# Patient Record
Sex: Female | Born: 1965
Health system: Southern US, Community
[De-identification: ages and names within clinical notes are randomized; demographics above are authoritative.]

## PROBLEM LIST (undated history)

## (undated) DIAGNOSIS — R51 Headache: Secondary | ICD-10-CM

## (undated) DIAGNOSIS — E559 Vitamin D deficiency, unspecified: Secondary | ICD-10-CM

## (undated) DIAGNOSIS — K529 Noninfective gastroenteritis and colitis, unspecified: Secondary | ICD-10-CM

## (undated) DIAGNOSIS — H209 Unspecified iridocyclitis: Secondary | ICD-10-CM

## (undated) DIAGNOSIS — L719 Rosacea, unspecified: Secondary | ICD-10-CM

## (undated) DIAGNOSIS — R350 Frequency of micturition: Secondary | ICD-10-CM

## (undated) DIAGNOSIS — I8390 Asymptomatic varicose veins of unspecified lower extremity: Secondary | ICD-10-CM

## (undated) HISTORY — DX: Rosacea, unspecified: L71.9

## (undated) HISTORY — DX: Frequency of micturition: R35.0

## (undated) HISTORY — DX: Asymptomatic varicose veins of unspecified lower extremity: I83.90

## (undated) HISTORY — DX: Headache: R51

## (undated) HISTORY — DX: Unspecified iridocyclitis: H20.9

## (undated) HISTORY — DX: Vitamin D deficiency, unspecified: E55.9

## (undated) HISTORY — DX: Noninfective gastroenteritis and colitis, unspecified: K52.9

## (undated) HISTORY — PX: COLONOSCOPY: SHX174

---

## 2001-05-02 ENCOUNTER — Other Ambulatory Visit: Admission: RE | Admit: 2001-05-02 | Discharge: 2001-05-02 | Payer: Self-pay | Admitting: Obstetrics and Gynecology

## 2001-12-26 ENCOUNTER — Inpatient Hospital Stay (HOSPITAL_COMMUNITY): Admission: RE | Admit: 2001-12-26 | Discharge: 2001-12-29 | Payer: Self-pay | Admitting: Obstetrics and Gynecology

## 2002-04-27 ENCOUNTER — Other Ambulatory Visit: Admission: RE | Admit: 2002-04-27 | Discharge: 2002-04-27 | Payer: Self-pay | Admitting: Family Medicine

## 2005-07-20 ENCOUNTER — Ambulatory Visit (HOSPITAL_COMMUNITY): Admission: RE | Admit: 2005-07-20 | Discharge: 2005-07-20 | Payer: Self-pay | Admitting: General Surgery

## 2005-07-20 ENCOUNTER — Encounter (INDEPENDENT_AMBULATORY_CARE_PROVIDER_SITE_OTHER): Payer: Self-pay | Admitting: General Surgery

## 2006-05-31 ENCOUNTER — Encounter (INDEPENDENT_AMBULATORY_CARE_PROVIDER_SITE_OTHER): Payer: Self-pay | Admitting: Specialist

## 2006-05-31 ENCOUNTER — Ambulatory Visit (HOSPITAL_COMMUNITY): Admission: RE | Admit: 2006-05-31 | Discharge: 2006-05-31 | Payer: Self-pay | Admitting: General Surgery

## 2007-08-07 ENCOUNTER — Other Ambulatory Visit: Admission: RE | Admit: 2007-08-07 | Discharge: 2007-08-07 | Payer: Self-pay | Admitting: Obstetrics and Gynecology

## 2007-08-11 ENCOUNTER — Ambulatory Visit (HOSPITAL_COMMUNITY): Admission: RE | Admit: 2007-08-11 | Discharge: 2007-08-11 | Payer: Self-pay | Admitting: Obstetrics & Gynecology

## 2008-07-21 ENCOUNTER — Ambulatory Visit (HOSPITAL_COMMUNITY): Admission: RE | Admit: 2008-07-21 | Discharge: 2008-07-21 | Payer: Self-pay | Admitting: Obstetrics and Gynecology

## 2008-11-25 ENCOUNTER — Other Ambulatory Visit: Admission: RE | Admit: 2008-11-25 | Discharge: 2008-11-25 | Payer: Self-pay | Admitting: Obstetrics & Gynecology

## 2008-12-01 ENCOUNTER — Ambulatory Visit: Payer: Self-pay | Admitting: Internal Medicine

## 2008-12-24 ENCOUNTER — Ambulatory Visit: Payer: Self-pay | Admitting: Internal Medicine

## 2008-12-24 ENCOUNTER — Encounter: Payer: Self-pay | Admitting: Internal Medicine

## 2008-12-24 ENCOUNTER — Ambulatory Visit (HOSPITAL_COMMUNITY): Admission: RE | Admit: 2008-12-24 | Discharge: 2008-12-24 | Payer: Self-pay | Admitting: Internal Medicine

## 2008-12-31 ENCOUNTER — Encounter: Payer: Self-pay | Admitting: Internal Medicine

## 2009-02-02 DIAGNOSIS — K5289 Other specified noninfective gastroenteritis and colitis: Secondary | ICD-10-CM | POA: Insufficient documentation

## 2009-02-02 DIAGNOSIS — Z87898 Personal history of other specified conditions: Secondary | ICD-10-CM

## 2009-03-25 ENCOUNTER — Ambulatory Visit: Payer: Self-pay | Admitting: Internal Medicine

## 2009-03-25 DIAGNOSIS — K5130 Ulcerative (chronic) rectosigmoiditis without complications: Secondary | ICD-10-CM | POA: Insufficient documentation

## 2009-03-28 ENCOUNTER — Encounter: Payer: Self-pay | Admitting: Gastroenterology

## 2009-06-17 ENCOUNTER — Encounter (INDEPENDENT_AMBULATORY_CARE_PROVIDER_SITE_OTHER): Payer: Self-pay

## 2009-06-30 ENCOUNTER — Encounter: Payer: Self-pay | Admitting: Gastroenterology

## 2009-07-04 LAB — CONVERTED CEMR LAB
CO2: 23 meq/L (ref 19–32)
Calcium: 9.6 mg/dL (ref 8.4–10.5)
Chloride: 106 meq/L (ref 96–112)
Creatinine, Ser: 0.74 mg/dL (ref 0.40–1.20)
Glucose, Bld: 68 mg/dL — ABNORMAL LOW (ref 70–99)

## 2009-09-09 ENCOUNTER — Encounter (INDEPENDENT_AMBULATORY_CARE_PROVIDER_SITE_OTHER): Payer: Self-pay | Admitting: *Deleted

## 2010-01-05 ENCOUNTER — Other Ambulatory Visit: Admission: RE | Admit: 2010-01-05 | Discharge: 2010-01-05 | Payer: Self-pay | Admitting: Obstetrics and Gynecology

## 2011-02-01 LAB — BASIC METABOLIC PANEL
BUN: 6 mg/dL (ref 6–23)
CO2: 27 mEq/L (ref 19–32)
Calcium: 8.4 mg/dL (ref 8.4–10.5)
Creatinine, Ser: 0.84 mg/dL (ref 0.4–1.2)
GFR calc non Af Amer: >60 mL/min (ref >60)
Potassium: 3.7 mEq/L (ref 3.5–5.1)
Sodium: 136 mEq/L (ref 135–145)

## 2011-03-06 NOTE — Op Note (Signed)
NAME:  Rita Smith, Rita Smith               ACCOUNT NO.:  [id number]   MEDICAL RECORD NO.:  [id number]          PATIENT TYPE:  AMB   LOCATION:  DAY                           FACILITY:  [facility]   PHYSICIAN:  R. Roetta Sessions, M.D. DATE OF BIRTH:  1966-04-22   DATE OF PROCEDURE:  12/24/2008  DATE OF DISCHARGE:                               OPERATIVE REPORT   INDICATIONS FOR PROCEDURE:  A 42-year lady with a 5-year history of  intermittent bloody diarrhea and some tenesmus.  Prior colon to sigmoid  artery suggested left-sided proctocolitis.  Partial response from  topical mesalamine.  Colonoscopy is now being done to further stage  inflammatory bowel disease and size up the extent of disease.  Risks,  benefits and alternatives have been reviewed, questions answered.  Please see documentation in the medical record.   PROCEDURE NOTE:  O2 saturation, blood pressure, pulse and respirations  were monitored throughout the entire procedure.   CONSCIOUS SEDATION:  Versed 7 mg IV, Demerol 100 mg IV in divided doses.   INSTRUMENTATION:  Pentax video chip system.   FINDINGS:  Digital rectal exam revealed no abnormalities.   ENDOSCOPIC FINDINGS:  The prep was good.  Examination of rectum  demonstrated diffuse proctitis with the entire loss of normal vascular  pattern, friability and erosions.  Changes extended up to the  rectosigmoid junction 20 to 25 cm and then abruptly tapered off more  proximally.  Attempted to do retroflex but the rectal vault was small  but for the same reason I was able see the rectal mucosa very well, en  face appeared normal.  Colon:  Colonic mucosa was surveyed from the rectosigmoid junction to  the left, transverse, right colon to area of appendiceal orifice,  ileocecal valve and cecum.  These structures well seen and photographed  for the record.  Terminal ileum was intubated 10 cm.  From this level,  the scope was slowly and cautiously withdrawn.  All previously mentioned  mucosal surfaces were again seen.  The colonic mucosa as well as the  terminal ileal mucosa appeared entirely normal.  Several biopsies of the  ascending and descending segments were taken for histological  perspective.  The scope was pulled down in the rectum where biopsies of  the inflamed rectal mucosa were taken for histologic study.  The patient  tolerated the procedure well and as reacted in endoscopy.   IMPRESSION:  Inflammatory changes of the rectum up to the rectosigmoid  20 to 25 cm consistent with inflammatory bowel disease/ proctitis status  post biopsy, normal-appearing colon and terminal ileum, status post  segmental biopsy.   RECOMMENDATIONS:  Begin Lialda 4.8 grams daily.  Stop Rowasa enemas,  begin hydrocortisone enemas 100 gram enema, one per bedtime x2 weeks.  Will check a baseline BMET today.  We will plan to see this nice lady  back in the office in 6 weeks.      Jonathon Bellows, M.D.  Electronically Signed     RMR/MEDQ  D:  12/24/2008  T:  12/24/2008  Job:  [id number]   cc:   Dr. Lovell Sheehan  Dr. Phillips Odor

## 2011-03-06 NOTE — H&P (Signed)
NAME:  Rita Smith, Rita Smith               ACCOUNT NO.:  [id number]   MEDICAL RECORD NO.:  [id number]          PATIENT TYPE:  AMB   LOCATION:  DAY                           FACILITY:  [facility]   PHYSICIAN:  R. Roetta Sessions, M.D. DATE OF BIRTH:  [birth year]   DATE OF ADMISSION:  DATE OF DISCHARGE:  LH                              HISTORY & PHYSICAL   CHIEF COMPLAINT:  History of proctocolitis, bloody diarrhea.   HISTORY OF PRESENT ILLNESS:  Ms. Rita Smith is a very pleasant 45-  year-old Caucasian female, followed primarily by Dr. Colette Ribas  who, over the past 5 years or so, has had problems with tenesmus,  intermittent diarrhea and blood per rectum.  She saw Dr. Lovell Sheehan  previously who performed a colonoscopy back in 2006 and found some left-  sided proctocolitis.  She had recurrent symptoms in 2007, underwent a  sigmoidoscopy, also by Dr. Lovell Sheehan.  She was found to have again some  left-sided mild focal proctitis.  The sigmoid colon appeared  endoscopically normal according to Dr. Lovell Sheehan' notes.  Biopsies from  the descending colon in 2006 demonstrated chronic active colitis with  mild cryptitis and mild crypt abscess formation.  Ms. Rita Smith has  stuttering symptoms.  She tells me she does worsen around the Christmas  holidays.  Symptoms can be intermittent throughout the year.  She does a  little bit better during the summertime.  She may have upwards of six  bowel movements daily, not infrequently associated with blood mixed in  within the stool.  She has not lost any weight.  There is no family  history of inflammatory bowel disease or colorectal cancer.  Interestingly, she had a red eye recently and saw the ophthalmologist  and she describes being told that she had uveitis and this is associated  with inflammatory bowel disease.  She has not had any other symptoms  such as odynophagia, dysphagia, early satiety or reflux symptoms, nausea  or vomiting.  She has not had any melena.   She also occasionally takes  Advil, Aleve and has been on penicillin recently, related to some dental  problems, including a root canal.  Ms. Rita Smith has been on topical  mesalamine therapy in the way of Rowasa enemas intermittently for the  past few years with modest to moderate improvement in her symptoms but  she never feels that she gets remission.  Ms. Rita Smith does note she has  lost 20 pounds over the past one year, but she went to [employer]  and she has maintained a conscious effort to lose weight.   PAST MEDICAL HISTORY:  Significant for migraine headaches.   PAST SURGERIES:  C-section times one.  Colonoscopy and EGD as described  above.   CURRENT MEDICATIONS:  1. Rowasa enemas daily.  2. Imitrex p.r.n.  3. Allegra p.r.n.  4. Oracea  daily.   ALLERGIES:  NO KNOWN DRUG ALLERGIES   FAMILY HISTORY:  Mother age 47, in good health.  Father died, age 57,  with heart disease.  Two healthy brothers.  No history chronic GI or  liver  illness.   SOCIAL HISTORY:  Patient is married and has two children.  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  No tobacco,  rarely consumes alcohol.   REVIEW OF SYSTEMS:  Red eye, history of uveitis, as noted above.  No  skin or joint problems.  No chest pain or dyspnea on exertion.  No  fever, chills.  No change in weight.   PHYSICAL EXAMINATION:  Pleasant 45-year lady, resting comfortably.  VITAL SIGNS:  Weight 175, height 5 feet 7, temperature 98.2, BP 100/74,  pulse 72.  SKIN:  Warm and dry.  HEENT:  No scleral icterus.  Conjunctivae pink.  CHEST/LUNGS:  Clear to auscultation.  CARDIAC EXAM:  Regular rate and rhythm without murmur, gallop or rub.  ABDOMEN:  Nondistended.  Positive bowel sounds, soft, entirely nontender  without appreciable mass or organomegaly.  EXTREMITIES: No edema.  RECTAL:  Exam deferred to time of colonoscopy.   LABORATORY DATA:  From November 29, 2008, through [facility], CBC  came  back completely normal.  Chem 20 came back completely normal.  LDL  cholesterol slightly high at 107, TSH normal at 3.985.  Vitamin D 25  hydroxy level lower limit of  normal at 31.   IMPRESSION:  Ms. Rita Smith is a pleasant 45 year old lady with at  least a 5-year history of intermittent bloody diarrhea and some  tenesmus.  Prior colonoscopy and sigmoidoscopy highly suggestive of at  least left-sided proctocolitis.  She has gotten only a partial response  to topical mesalamine therapy in the way of enemas.  At this point, have  to be concerned that she may be beyond potential efficacy of enema  therapy alone.  I told Ms. Rita Smith that she ought to go ahead and have a  colonoscopy with segmental biopsies so we can restage her disease.  I  suspect she will end up on systemic mesalamine therapy.  I discussed  with her the potential for antibiotic therapy and nonsteroidal agents to  exacerbate inflammatory bowel disease.  Will plan to perform  ileocolonoscopy in the very near future with segmental biopsies as  described.  Risks, benefits, alternatives and limitations have been  reviewed.  Her questions were answered.  She is agreeable.  We will make  further recommendations in the very near future.   We will keep both Doctors Lovell Sheehan and Phillips Odor updated on her evaluation.      Jonathon Bellows, M.D.  Electronically Signed     RMR/MEDQ  D:  12/01/2008  T:  12/01/2008  Job:  [id number]   cc:   Dalia Heading, M.D.  Fax: [phone]   Corrie Mckusick, M.D.  Fax: [phone]

## 2011-03-09 NOTE — H&P (Signed)
[facility]  Patient:    Rita Smith, Rita Smith Visit Number: [id number] MRN: [id number]          Service Type: OBS Location: 4A A418 01 Attending Physician:  Tilda Burrow Dictated by:   Cathie Beams, C.N.M. Admit Date:  12/26/2001   CC:         [facility]   History and Physical  CHIEF COMPLAINT:  Regular uterine contractions since 0400 and leaking small amount of amniotic fluid.  HISTORY OF PRESENT ILLNESS:  Rita Smith is a 45 year old gravida 2, para 1, with an EDC of January 21, 2002, based on sure last menstrual period and correlating first and second trimester ultrasounds placing her at 36 weeks. She began prenatal care early in her first trimester and has had regular visits throughout. Blood pressures have been 100-120/60-80. Total weight gain 35 pounds with an elevated fundal height.  PRENATAL LABORATORY AND ACCESSORY DATA:  Blood type B positive, rubella immune, HBsAG, HIV, gonorrhea, chlamydia, GBS are all negative. One-hour GGT was 135.  Ultrasound had shown a low-lying placenta without previa and mild polyhydramnios.  PAST MEDICAL HISTORY:  History of migraines.  ALLERGIES:  No known drug allergies.  PAST SURGICAL HISTORY:  Noncontributory. No history of blood transfusions or anesthesia complications.  FAMILY HISTORY:  Positive for hypertension, lung and colon cancer.  SOCIAL HISTORY:  Married and employed.  GYNECOLOGICAL HISTORY:  1997 induction at 41 weeks. A spontaneous vaginal delivery of 7 pound 11 ounce female infant after 15 hour labor.  PHYSICAL EXAMINATION:  HEENT:  Within normal limits.  HEART:  Regular rate and rhythm.  LUNGS:  Clear to auscultation bilaterally.  ABDOMEN:  Soft and nontender with mild contractions about every 5 to 6 minutes.  CERVIX:  4 cm, 90% effaced, 0 to -1 station, leaking large amounts of clear amniotic fluid.  EXTREMITIES:  Legs are negative.  IMPRESSION: 1. Intrauterine  pregnancy at 36 weeks, beginning active labor. 2. Reassuring maternal-fetal status.  PLAN:  Expectant management at this time. Dictated by:   Cathie Beams, C.N.M. Attending Physician:  Tilda Burrow DD:  12/26/01 TD:  12/26/01 Job: [id number]

## 2011-03-09 NOTE — Discharge Summary (Signed)
[facility]  Patient:    Rita Smith, Rita Smith Visit Number: [id number] MRN: [id number]          Service Type: OBS Location: 4A A418 01 Attending Physician:  Tilda Burrow Dictated by:   Duane Lope, M.D. Admit Date:  12/26/2001 Discharge Date: 12/29/2001                             Discharge Summary  DISCHARGE DIAGNOSES: 1. Secondary arrest of dilatation, descent, the first stage of labor. 2. Status post a primary low transverse cesarean section. 3. Unremarkable postoperative course.  PROCEDURE:  Primary Cesarean section.  Please refer to the transcribed history and physical in antepartum chart for details of admission to the hospital.  HOSPITAL COURSE:  The patient presented to the hospital 36 1/[redacted] weeks gestation with ruptured membranes and in labor. Her labor was felt to be hypoactive and she was augmented with oxytocin. However, despite documented adequate labor with an IUPC, for several hours the patient did not change her cervix from 5 cm. As a result she was taken down for a C-section. There was also some late fetal tachycardia which prompted that C-section as well. The patient did well. She delivered a healthy 7 pound 3 ounce female. Surgery was without difficulty. Postoperatively the patient remained afebrile, tolerated clear liquids and a regular diet, had progression in bowel function with flatus and bowel movement. She ambulated without difficulty, tolerated oral pain medicine, and voided without problems. Her hemoglobin and hematocrit postoperatively were good. On postop day #3 it was 10.7, 30.7. As a result, she was discharged to home on the morning of postop day #3 in good stable condition to follow up in the office on Wednesday to have her staples removed. Her incision was clean, dry, and intact at the time of discharge. She is given Tylox and Motrin for pain. Dictated by:   Duane Lope, M.D. Attending Physician:  Tilda Burrow DD:   12/29/01 TD:  12/29/01 Job: [id number]

## 2011-03-09 NOTE — Op Note (Signed)
[facility]  Patient:    Rita Smith, Rita Smith Visit Number: [id number] MRN: [id number]          Service Type: OBS Location: 4A A418 01 Attending Physician:  Tilda Burrow Dictated by:   Christin Bach, M.D. Proc. Date: 12/26/01 Admit Date:  12/26/2001   CC:         Donna Bernard, M.D.   Operative Report  PREOPERATIVE DIAGNOSES: 1. Pregnancy at 36-1/[redacted] weeks gestation. 2. Spontaneous rupture of membranes with active labor. 3. Failure to progress. 4. Uncertain fetal status.  POSTOPERATIVE DIAGNOSES: 1. Pregnancy at 36-1/[redacted] weeks gestation. 2. Spontaneous rupture of membranes with active labor. 3. Failure to progress. 4. Uncertain fetal status.  OPERATION:  Primary low transverse cervical cesarean section  SURGEON:  Christin Bach, M.D.  ASSISTANT:  Cathie Beams, C.N.M.  ANESTHESIA:  Spinal.  CRNA after discontinuation of epidural catheter used during labor management.  COMPLICATIONS:  Inadvertent scratch to left index finger of surgical assistant, Cathie Beams, C.N.M.  FINDINGS:  A 7 pound 2.6 ounce female infant in the left occiput transverse position with significant molding of the vertex of a very round-headed infant not likely to deliver vaginally.  Conditions: Very high bladder flap on the anterior abdominal wall.  Extension of uterine transverse incision into uterine vessels on the right side requiring ligation of right uterine artery and vein.  DESCRIPTION OF PROCEDURE:  The patient was taken to the operating room and prepped and draped in the usual fashion for lower abdominal surgery with spinal analgesia introduced.  Pfannenstiel-type incision was performed with ease,  entering the abdomen above a very high bladder, opening the peritoneum transversely, then developing bladder flap on the well-developed lower uterine segment.  The uterine incision was made carefully and transversely, easily identifying the amniotic  fluid sac with transverse extension of the incision using index finger traction.  The fetal vertex was dislodged from the pelvic inlet, lifted up, and rotated into the incision whereupon fundal pressure was used to deliver the infant.  There was significant technical difficulty getting the baby out as the shoulders were caught at the uterine incision due to the relatively high bladder position and the tortuous angles required to deliver the infant.  We used fundal pressures as primary propulsive force on the infant when combined with index finger under each axilla to deliver his shoulders.  The infant was delivered in good condition, passed to the awaiting Pediatrician, Dr. Simone Curia in good condition.  (See his note for further details.)  The uterus was immediately filled with generous bleeding and found to have brisk bleeding from the uterine vessels on the right side which were crossclamped with Pennington clamps.  Two encircling sutures of 0 chromic were placed above and below the laceration to reduce flow through the uterine vessels on the left side.  We then irrigated the uterine cavity and closed the uterus in two-layer closure with the first layer being running locking 0 chromic and the second layer being continuous running 0 chromic.  The right angle once again required two figure-of-eight sutures to complete hemostasis after it began to ooze during assessment of the bladder flap.  The bladder flap was then reapproximated using continuous running 2-0 Prolene.  The uterus had been irrigated when opened.  The abdomen was now irrigated and generous blood and clots removed from the abdomen.  The fluid returned clear, and then we closed the anterior peritoneum with 2-0 chromic.  One inadvertent complication occurred during closure of the  fascia which was that, during the continuous running 0 Vicryl closure of the fascia, we scraped the left index finger of the surgical assistant,  enough to break the skin but not cause bleeding.  It was a linear surface scrape, approximately 5 to 7 mm in length.   Ms. Marzella Schlein promptly broke out of the case and took care of the cleansing and scrubbing of the incision as per precaution.  Fascial closure was completed, 2-0 chromic used for the subcutaneous tissue and staple closure completed the procedure.  The patient tolerated the procedure well and went to recovery in good condition. Dictated by:   Christin Bach, M.D. Attending Physician:  Tilda Burrow DD:  12/26/01 TD:  12/28/01 Job: [id number]

## 2011-03-09 NOTE — Op Note (Signed)
[facility]  Patient:    KAYTLAN, BEHRMAN Visit Number: [id number] MRN: [id number]          Service Type: OBS Location: 4A A418 01 Attending Physician:  Tilda Burrow Dictated by:   Christin Bach, M.D. Proc. Date: 12/26/01 Admit Date:  12/26/2001                             Operative Report  TIME:  10 a.m., December 26, 2001.  PROCEDURE:  Lumbar epidural catheter placement for labor analgesia.  SURGEON:  Christin Bach, M.D.  DETAILS OF PROCEDURE:  The patient was placed in sitting position after fluid bolus and IV Pepcid.  The forward flexion position was used, and back prepped and draped.  Loss of resistance technique was used at L3-4 interspace to find the epidural space where 5 cc of 1.5%  Xylocaine with 1:100,000 epinephrine dilution was injection.  Then the epidural catheter was inserted 3 cm into the epidural space.  The patient had excellent effect for the epidural which was taped to the back in standard fashion and attached to a pump where a 5 cc bolus of 0.125% Marcaine was administered with continuous infusion at 12 cc per hour initiated.  The patient tolerated the procedure well and with T10 epidural level achieved. Dictated by:   Christin Bach, M.D. Attending Physician:  Tilda Burrow DD:  12/26/01 TD:  12/27/01 Job: [id number]

## 2011-03-09 NOTE — H&P (Signed)
Rita Smith, Rita Smith               ACCOUNT NO.:  [id number]   MEDICAL RECORD NO.:  [id number]          PATIENT TYPE:  AMB   LOCATION:                                FACILITY:  [facility]   PHYSICIAN:  Dalia Heading, M.D.  DATE OF BIRTH:  Feb 21, 1966   DATE OF ADMISSION:  05/31/2006  DATE OF DISCHARGE:  LH                                HISTORY & PHYSICAL   CHIEF COMPLAINT:  Hematochezia, history of colitis.   HISTORY OF PRESENT ILLNESS:  The patient is a 45 year old white female who  is referred for endoscopic evaluation.  She needs a flexible sigmoidoscopy  for followup of colitis.  Her hematochezia returned recently after having  resolved after Rowasa enemas last year.  She had a colonoscopy in September  2006 which revealed descending colitis which was nonspecific in nature.  There is no family history of colon carcinoma.   PAST MEDICAL HISTORY:  Includes sinusitis, migraine headaches.   PAST SURGICAL HISTORY:  As noted above, C-sections.   CURRENT MEDICATIONS:  Allegra, Imitrex, Advil.   ALLERGIES:  No known drug allergies.   REVIEW OF SYSTEMS:  Noncontributory.   PHYSICAL EXAMINATION:  GENERAL:  The patient a well-developed, well-  nourished white female in no acute distress.  LUNGS:  Clear to auscultation with equal breath sounds bilaterally.  HEART: Examination reveals regular rate and rhythm without history, S4,  murmurs.  ABDOMEN: Soft, nontender, nondistended.  No hepatosplenomegaly or masses  noted.  RECTAL:  Examination was deferred to the procedure.   IMPRESSION:  1.  Hematochezia.  2.  History of colitis.   PLAN:  The patient is scheduled for flexible sigmoidoscopy on May 31, 2006.  The risks and benefits of the procedure including bleeding and  perforation were fully explained to the patient, gave informed consent.      Dalia Heading, M.D.  Electronically Signed     MAJ/MEDQ  D:  05/28/2006  T:  05/28/2006  Job:  [id number]   cc:   [facility]  Fax: [phone]   Corrie Mckusick, M.D.  Fax: [phone]

## 2011-03-09 NOTE — H&P (Signed)
NAME:  Rita Smith, Rita Smith NO.:  [id number]   MEDICAL RECORD NO.:  [id number]          PATIENT TYPE:  AMB   LOCATION:                                FACILITY:  [facility]   PHYSICIAN:  Dalia Heading, M.D.  DATE OF BIRTH:  January 05, 1966   DATE OF ADMISSION:  DATE OF DISCHARGE:  LH                                HISTORY & PHYSICAL   CHIEF COMPLAINT:  Hematochezia and diarrhea.   HISTORY OF PRESENT ILLNESS:  The patient is a 45 year old white female who  is referred for endoscopic evaluation. She needs a colonoscopy for  hematochezia and diarrhea. Both have been present for over one month. No  abdominal pain, weight loss, nausea, vomiting, constipation, or melena have  been noted. She has never had a colonoscopy. There is no family history of  colon carcinoma. She does have a history of hemorrhoidal disease.   PAST MEDICAL HISTORY:  Sinusitis and migraines headaches.   PAST SURGICAL HISTORY:  Cesarean sections.   CURRENT MEDICATIONS:  Allegra, Imitrex, Advil.   ALLERGIES:  No known drug allergies.   REVIEW OF SYSTEMS:  Noncontributory.   PHYSICAL EXAMINATION:  GENERAL:  The patient is a well-developed, well-  nourished, white female in no acute distress. She is afebrile and vital  signs are stable.  LUNGS:  Clear to auscultation with equal breath sounds bilaterally.  HEART:  Reveals regular rate and rhythm without S3, S4, or murmurs.  ABDOMEN:  Soft, nontender, nondistended. No hepatosplenomegaly or masses are  noted.  RECTAL:  Deferred to the procedure.   IMPRESSION:  Hematochezia, diarrhea.   PLAN:  The patient is scheduled for colonoscopy on July 20, 2005. The  risks and benefits of the procedure including bleeding and perforation were  fully explained to the patient who gave informed consent.      Dalia Heading, M.D.  Electronically Signed     MAJ/MEDQ  D:  07/10/2005  T:  07/10/2005  Job:  [id number]   cc:   [facility]  Fax:  [phone]   Corrie Mckusick, M.D.  Fax: [phone]

## 2011-11-22 ENCOUNTER — Other Ambulatory Visit (HOSPITAL_COMMUNITY)
Admission: RE | Admit: 2011-11-22 | Discharge: 2011-11-22 | Disposition: A | Discharge status: Home / Self Care | Payer: BC Managed Care – PPO | Source: Ambulatory Visit | Attending: Obstetrics and Gynecology | Admitting: Obstetrics and Gynecology

## 2011-11-22 ENCOUNTER — Other Ambulatory Visit: Payer: Self-pay | Admitting: Adult Health

## 2011-11-22 DIAGNOSIS — Z01419 Encounter for gynecological examination (general) (routine) without abnormal findings: Secondary | ICD-10-CM | POA: Insufficient documentation

## 2012-08-01 ENCOUNTER — Other Ambulatory Visit: Payer: Self-pay | Admitting: Obstetrics and Gynecology

## 2012-08-01 DIAGNOSIS — Z139 Encounter for screening, unspecified: Secondary | ICD-10-CM

## 2012-08-07 ENCOUNTER — Ambulatory Visit (HOSPITAL_COMMUNITY)
Admission: RE | Admit: 2012-08-07 | Discharge: 2012-08-07 | Disposition: A | Discharge status: Home / Self Care | Payer: BC Managed Care – PPO | Source: Ambulatory Visit | Attending: Obstetrics and Gynecology | Admitting: Obstetrics and Gynecology

## 2012-08-07 DIAGNOSIS — Z1231 Encounter for screening mammogram for malignant neoplasm of breast: Secondary | ICD-10-CM | POA: Insufficient documentation

## 2012-08-07 DIAGNOSIS — Z139 Encounter for screening, unspecified: Secondary | ICD-10-CM

## 2013-04-03 ENCOUNTER — Encounter: Payer: Self-pay | Admitting: *Deleted

## 2013-04-06 ENCOUNTER — Ambulatory Visit: Payer: Self-pay | Admitting: Women's Health

## 2013-04-14 ENCOUNTER — Ambulatory Visit (INDEPENDENT_AMBULATORY_CARE_PROVIDER_SITE_OTHER): Payer: BC Managed Care – PPO | Admitting: Women's Health

## 2013-04-14 ENCOUNTER — Encounter: Payer: Self-pay | Admitting: Women's Health

## 2013-04-14 VITALS — BP 130/82 | Ht 67.0 in | Wt 186.4 lb

## 2013-04-14 DIAGNOSIS — R1900 Intra-abdominal and pelvic swelling, mass and lump, unspecified site: Secondary | ICD-10-CM

## 2013-04-14 NOTE — Patient Instructions (Signed)
Perimenopause Perimenopause is the time when your body begins to move into the menopause (no menstrual period for 12 straight months). It is a natural process. Perimenopause can begin 2 to 8 years before the menopause and usually lasts for one year after the menopause. During this time, your ovaries may or may not produce an egg. The ovaries vary in their production of estrogen and progesterone hormones each month. This can cause irregular menstrual periods, difficulty in getting pregnant, vaginal bleeding between periods and uncomfortable symptoms. CAUSES  Irregular production of the ovarian hormones, estrogen and progesterone, and not ovulating every month.  Other causes include:  Tumor of the pituitary gland in the brain.  Medical disease that affects the ovaries.  Radiation treatment.  Chemotherapy.  Unknown causes.  Heavy smoking and excessive alcohol intake can bring on perimenopause sooner. SYMPTOMS   Hot flashes.  Night sweats.  Irregular menstrual periods.  Decrease sex drive.  Vaginal dryness.  Headaches.  Mood swings.  Depression.  Memory problems.  Irritability.  Tiredness.  Weight gain.  Trouble getting pregnant.  The beginning of losing bone cells (osteoporosis).  The beginning of hardening of the arteries (atherosclerosis). DIAGNOSIS  Your caregiver will make a diagnosis by analyzing your age, menstrual history and your symptoms. They will do a physical exam noting any changes in your body, especially your female organs. Female hormone tests may or may not be helpful depending on the amount and when you produce the female hormones. However, other hormone tests may be helpful (ex. thyroid hormone) to rule out other problems. TREATMENT  The decision to treat during the perimenopause should be made by you and your caregiver depending on how the symptoms are affecting you and your life style. There are various treatments available such as:  Treating  individual symptoms with a specific medication for that symptom (ex. tranquilizer for depression).  Herbal medications that can help specific symptoms.  Counseling.  Group therapy.  No treatment. HOME CARE INSTRUCTIONS   Before seeing your caregiver, make a list of your menstrual periods (when the occur, how heavy they are, how long between periods and how long they last), your symptoms and when they started.  Take the medication as recommended by your caregiver.  Sleep and rest.  Exercise.  Eat a diet that contains calcium (good for your bones) and soy (acts like estrogen hormone).  Do not smoke.  Avoid alcoholic beverages.  Taking vitamin E may help in certain cases.  Take calcium and vitamin D supplements to help prevent bone loss.  Group therapy is sometimes helpful.  Acupuncture may help in some cases. SEEK MEDICAL CARE IF:   You have any of the above and want to know if it is perimenopause.  You want advice and treatment for any of your symptoms mentioned above.  You need a referral to a specialist (gynecologist, psychiatrist or psychologist). SEEK IMMEDIATE MEDICAL CARE IF:   You have vaginal bleeding.  Your period lasts longer than 8 days.  You periods are recurring sooner than 21 days.  You have bleeding after intercourse.  You have severe depression.  You have pain when you urinate.  You have severe headaches.  You develop vision problems. Document Released: 11/15/2004 Document Revised: 12/31/2011 Document Reviewed: 08/05/2008 [employer] Patient Information 2014 [employer].  

## 2013-04-14 NOTE — Progress Notes (Signed)
Subjective:     Patient ID: Rita Smith, female   DOB: [birth year], 47 y.o.   MRN: [id number]  HPI Rita Smith is a 47 y.o. G2P2 Caucasian female who presents w/ report of pelvic fullness and pressure x app 3 months. States periods have always been regular, but feels like they may be <q 28days, have started to become a little heavier and is now having small clots. Cramping hasn't really gotten any worse.  Denies abdominal pain, mood swings, vaginal dryness, dyspareunia. States mother went through menopause in mid-late 12's. Can't remember last pap smear.   Review of Systems    Pertinent positives as above Objective:   Physical Exam    BP 130/82  Ht 5\' 7"  (1.702 m)  Wt 186 lb 6.4 oz (84.55 kg)  BMI 29.19 kg/m2  LMP 04/09/2013  Patient's last menstrual period was 04/09/2013.  Slight tenderness to palpation LLQ Spec exam: on menses, normal amount, no malodor, cx appears normal Bimanual: no cmt, uterus feels normal size/shape/contour, adnexae non-tender and non-palpable, abdomen feels full/tight as if pt tensing  Assessment:     47 y.o. G2P2 female Pelvic fullness/pressure Increased flow w/ periods, clots    Plan:     Pelvic u/s ASAP Pap/physical as scheduled w/ JAG 7/22  Cheron Every [place], [place] 04/14/2013 5:19 PM

## 2013-04-21 ENCOUNTER — Ambulatory Visit: Payer: BC Managed Care – PPO | Admitting: Women's Health

## 2013-04-21 ENCOUNTER — Other Ambulatory Visit: Payer: BC Managed Care – PPO

## 2013-04-22 ENCOUNTER — Other Ambulatory Visit: Payer: BC Managed Care – PPO

## 2013-05-11 ENCOUNTER — Ambulatory Visit (INDEPENDENT_AMBULATORY_CARE_PROVIDER_SITE_OTHER): Payer: BC Managed Care – PPO | Admitting: Women's Health

## 2013-05-11 ENCOUNTER — Other Ambulatory Visit: Payer: Self-pay | Admitting: Women's Health

## 2013-05-11 ENCOUNTER — Encounter: Payer: Self-pay | Admitting: Women's Health

## 2013-05-11 ENCOUNTER — Ambulatory Visit (INDEPENDENT_AMBULATORY_CARE_PROVIDER_SITE_OTHER): Payer: BC Managed Care – PPO

## 2013-05-11 VITALS — BP 112/80 | Ht 67.0 in | Wt 188.5 lb

## 2013-05-11 DIAGNOSIS — N920 Excessive and frequent menstruation with regular cycle: Secondary | ICD-10-CM | POA: Insufficient documentation

## 2013-05-11 DIAGNOSIS — R1900 Intra-abdominal and pelvic swelling, mass and lump, unspecified site: Secondary | ICD-10-CM

## 2013-05-11 NOTE — Progress Notes (Signed)
Subjective:     Patient ID: Rita Smith, female   DOB: Mar 28, 1966, 47 y.o.   MRN: [id number]  HPI KHALISE BILLARD is a 47 y.o. G2P2 female who presented almost a month ago w/ report of pelvic fullness and menorrhagia w/ clots. Pelvic u/s asap was ordered, and f/u for pap/physical. She had her pelvic u/s today and is here to discuss results. Reports pelvic fullness/pain has gotten better, but still having heavier/closer periods w/ clots.   Review of Systems Pertinent +/- as above    Objective:   Physical Exam Not performed today   Pelvic u/s:  Uterus 9.2 x 4.6 x 6.1 cm, anteverted  Endometrium 4.7 mm, symmetrical, no mass seen  Right ovary 1.8 x .9 x 1.5 cm,  Left ovary 2.2 x .8 x 1.1 cm,  No mass identified  Technician Comments:  Anteverted ut, em nml thickness, symmetrical,nabothian cysts noted within cx < 7 mm bilat ovs seen, no masses identified, cul de sac, no free fluid  Assessment:    Menorrhagia   Normal pelvic u/s    Plan:     F/U tomorrow as scheduled for pap/physical w/ JAG Will get TSH tomorrow      Marge Duncans, CNM 05/11/2013 11:39 AM

## 2013-05-12 ENCOUNTER — Encounter: Payer: Self-pay | Admitting: Adult Health

## 2013-05-12 ENCOUNTER — Other Ambulatory Visit (HOSPITAL_COMMUNITY)
Admission: RE | Admit: 2013-05-12 | Discharge: 2013-05-12 | Disposition: A | Discharge status: Home / Self Care | Payer: Self-pay | Source: Ambulatory Visit | Attending: Adult Health | Admitting: Adult Health

## 2013-05-12 ENCOUNTER — Ambulatory Visit (INDEPENDENT_AMBULATORY_CARE_PROVIDER_SITE_OTHER): Payer: BC Managed Care – PPO | Admitting: Adult Health

## 2013-05-12 VITALS — BP 120/74 | HR 76 | Ht 67.0 in | Wt 191.0 lb

## 2013-05-12 DIAGNOSIS — H209 Unspecified iridocyclitis: Secondary | ICD-10-CM | POA: Insufficient documentation

## 2013-05-12 DIAGNOSIS — Z87898 Personal history of other specified conditions: Secondary | ICD-10-CM

## 2013-05-12 DIAGNOSIS — Z1212 Encounter for screening for malignant neoplasm of rectum: Secondary | ICD-10-CM

## 2013-05-12 DIAGNOSIS — Z01419 Encounter for gynecological examination (general) (routine) without abnormal findings: Secondary | ICD-10-CM | POA: Insufficient documentation

## 2013-05-12 DIAGNOSIS — Z1151 Encounter for screening for human papillomavirus (HPV): Secondary | ICD-10-CM | POA: Insufficient documentation

## 2013-05-12 DIAGNOSIS — N920 Excessive and frequent menstruation with regular cycle: Secondary | ICD-10-CM

## 2013-05-12 LAB — HEMOCCULT GUIAC POC 1CARD (OFFICE)

## 2013-05-12 NOTE — Patient Instructions (Addendum)
Physical in 1 year Mammogram yearly Colonoscopy at 48  

## 2013-05-12 NOTE — Progress Notes (Signed)
Patient ID: Rita Smith, female   DOB: 07-Oct-1966, 47 y.o.   MRN: [id number] History of Present Illness: [name] is a 47 year old white female married in for a pap and physical.she was recently seen for pelvic pressure and menorrhagia, and had a normal [place].   Current Medications, Allergies, Past Medical History, Past Surgical History, Family History and Social History were reviewed in [employer] record.     Review of Systems: Patient denies any daily headaches, blurred vision, shortness of breath, chest pain, abdominal pain, problems with bowel movements, urination, or intercourse. No joint pain or major mood changes, she has had heavier periods with clots and headaches with her periods and maybe an occasional hot flash, she has had uveitis. We discussed menopausal symptoms.   Physical Exam:BP 120/74  Pulse 76  Ht 5\' 7"  (1.702 m)  Wt 191 lb (86.637 kg)  BMI 29.91 kg/m2  LMP 05/06/2013 General:  Well developed, well nourished, no acute distress Skin:  Warm and dry Neck:  Midline trachea, normal thyroid Lungs; Clear to auscultation bilaterally Breast:  No dominant palpable mass, retraction, or nipple discharge Cardiovascular: Regular rate and rhythm Abdomen:  Soft, non tender, no hepatosplenomegaly Pelvic:  External genitalia is normal in appearance.  The vagina is normal in appearance. The cervix is bulbous.Pap performed with HPV.  Uterus is felt to be normal size, shape, and contour.  No adnexal masses or tenderness noted. Rectal: Good sphincter tone, no polyps, or hemorrhoids felt.  Hemoccult negative. Extremities:  No swelling or varicosities noted Psych:  Alert and cooperative, seems happy   Impression: Yearly exam Menorrhagia Uveitis History of migraines History of colitis   Plan: Physical in 1 year Mammogram yearly  Colonoscopy at 48 Check CBC,CMP,TSH ANA,RF,ESR Call if decides wants meds for periods

## 2013-05-13 LAB — ANTI-NUCLEAR AB-TITER (ANA TITER): ANA Titer 1: 1:160 {titer} — ABNORMAL HIGH

## 2013-05-13 LAB — COMPREHENSIVE METABOLIC PANEL
ALT: 22 U/L (ref 0–35)
BUN: 11 mg/dL (ref 6–23)
CO2: 27 mEq/L (ref 19–32)
Calcium: 9.4 mg/dL (ref 8.4–10.5)
Creat: 0.94 mg/dL (ref 0.50–1.10)
Glucose, Bld: 96 mg/dL (ref 70–99)
Sodium: 139 mEq/L (ref 135–145)

## 2013-05-13 LAB — CBC
MCV: 90.4 fL (ref 78.0–100.0)
Platelets: 301 10*3/uL (ref 150–400)

## 2013-05-13 LAB — RHEUMATOID FACTOR: Rhuematoid fact SerPl-aCnc: 10 IU/mL (ref <=14)

## 2013-05-14 ENCOUNTER — Telehealth: Payer: Self-pay | Admitting: Adult Health

## 2013-05-14 NOTE — Telephone Encounter (Signed)
Pt aware of labs, repeat ANA in 6 months, pt in recall, discussed with Dr Despina Hidden

## 2013-09-02 ENCOUNTER — Other Ambulatory Visit: Payer: Self-pay | Admitting: Obstetrics and Gynecology

## 2013-09-02 DIAGNOSIS — Z139 Encounter for screening, unspecified: Secondary | ICD-10-CM

## 2013-09-14 ENCOUNTER — Ambulatory Visit (HOSPITAL_COMMUNITY)
Admission: RE | Admit: 2013-09-14 | Discharge: 2013-09-14 | Disposition: A | Discharge status: Home / Self Care | Payer: BC Managed Care – PPO | Source: Ambulatory Visit | Attending: Obstetrics and Gynecology | Admitting: Obstetrics and Gynecology

## 2013-09-14 DIAGNOSIS — Z139 Encounter for screening, unspecified: Secondary | ICD-10-CM

## 2013-09-14 DIAGNOSIS — Z1231 Encounter for screening mammogram for malignant neoplasm of breast: Secondary | ICD-10-CM | POA: Insufficient documentation

## 2013-09-18 ENCOUNTER — Other Ambulatory Visit: Payer: Self-pay | Admitting: Obstetrics and Gynecology

## 2013-09-18 DIAGNOSIS — R928 Other abnormal and inconclusive findings on diagnostic imaging of breast: Secondary | ICD-10-CM

## 2013-10-07 ENCOUNTER — Ambulatory Visit (HOSPITAL_COMMUNITY)
Admission: RE | Admit: 2013-10-07 | Discharge: 2013-10-07 | Disposition: A | Discharge status: Home / Self Care | Payer: BC Managed Care – PPO | Source: Ambulatory Visit | Attending: Obstetrics and Gynecology | Admitting: Obstetrics and Gynecology

## 2013-10-07 DIAGNOSIS — R928 Other abnormal and inconclusive findings on diagnostic imaging of breast: Secondary | ICD-10-CM

## 2013-10-23 ENCOUNTER — Telehealth: Payer: Self-pay

## 2013-10-23 NOTE — Telephone Encounter (Signed)
Needs appt after 1/22 for ANA

## 2013-11-13 ENCOUNTER — Other Ambulatory Visit: Payer: BC Managed Care – PPO

## 2013-11-16 ENCOUNTER — Other Ambulatory Visit: Payer: BC Managed Care – PPO

## 2013-11-16 ENCOUNTER — Encounter (INDEPENDENT_AMBULATORY_CARE_PROVIDER_SITE_OTHER): Payer: Self-pay

## 2013-11-16 DIAGNOSIS — R7989 Other specified abnormal findings of blood chemistry: Secondary | ICD-10-CM

## 2013-11-17 LAB — ANA: ANA: POSITIVE — AB

## 2013-11-17 LAB — ANTI-NUCLEAR AB-TITER (ANA TITER): ANA Titer 1: 1:40 {titer} — ABNORMAL HIGH

## 2013-11-18 ENCOUNTER — Telehealth: Payer: Self-pay | Admitting: Adult Health

## 2013-11-18 NOTE — Telephone Encounter (Signed)
Left message to call about labs 

## 2013-11-18 NOTE — Telephone Encounter (Signed)
Pt aware of labs,recheck in 6 months

## 2014-05-17 ENCOUNTER — Ambulatory Visit (INDEPENDENT_AMBULATORY_CARE_PROVIDER_SITE_OTHER): Payer: BC Managed Care – PPO | Admitting: Adult Health

## 2014-05-17 ENCOUNTER — Encounter: Payer: Self-pay | Admitting: Adult Health

## 2014-05-17 VITALS — BP 110/70 | HR 76 | Ht 67.0 in | Wt 193.0 lb

## 2014-05-17 DIAGNOSIS — Z1212 Encounter for screening for malignant neoplasm of rectum: Secondary | ICD-10-CM

## 2014-05-17 DIAGNOSIS — N920 Excessive and frequent menstruation with regular cycle: Secondary | ICD-10-CM

## 2014-05-17 DIAGNOSIS — Z01419 Encounter for gynecological examination (general) (routine) without abnormal findings: Secondary | ICD-10-CM

## 2014-05-17 DIAGNOSIS — I8390 Asymptomatic varicose veins of unspecified lower extremity: Secondary | ICD-10-CM | POA: Insufficient documentation

## 2014-05-17 HISTORY — DX: Asymptomatic varicose veins of unspecified lower extremity: I83.90

## 2014-05-17 LAB — HEMOCCULT GUIAC POC 1CARD (OFFICE): Fecal Occult Blood, POC: NEGATIVE

## 2014-05-17 LAB — CBC
HCT: 42 % (ref 36.0–46.0)
HEMOGLOBIN: 14.2 g/dL (ref 12.0–15.0)
MCH: 29.8 pg (ref 26.0–34.0)
MCHC: 33.8 g/dL (ref 30.0–36.0)
MCV: 88.1 fL (ref 78.0–100.0)
PLATELETS: 288 10*3/uL (ref 150–400)
RBC: 4.77 MIL/uL (ref 3.87–5.11)
RDW: 13.6 % (ref 11.5–15.5)
WBC: 7.8 10*3/uL (ref 4.0–10.5)

## 2014-05-17 NOTE — Patient Instructions (Addendum)
Menorrhagia Menorrhagia is the medical term for when your menstrual periods are heavy or last longer than usual. With menorrhagia, every period you have may cause enough blood loss and cramping that you are unable to maintain your usual activities. CAUSES  In some cases, the cause of heavy periods is unknown, but a number of conditions may cause menorrhagia. Common causes include:  A problem with the hormone-producing thyroid gland (hypothyroid).  Noncancerous growths in the uterus (polyps or fibroids).  An imbalance of the estrogen and progesterone hormones.  One of your ovaries not releasing an egg during one or more months.  Side effects of having an intrauterine device (IUD).  Side effects of some medicines, such as anti-inflammatory medicines or blood thinners.  A bleeding disorder that stops your blood from clotting normally. SIGNS AND SYMPTOMS  During a normal period, bleeding lasts between 4 and 8 days. Signs that your periods are too heavy include:  You routinely have to change your pad or tampon every 1 or 2 hours because it is completely soaked.  You pass blood clots larger than 1 inch (2.5 cm) in size.  You have bleeding for more than 7 days.  You need to use pads and tampons at the same time because of heavy bleeding.  You need to wake up to change your pads or tampons during the night.  You have symptoms of anemia, such as tiredness, fatigue, or shortness of breath. DIAGNOSIS  Your health care provider will perform a physical exam and ask you questions about your symptoms and menstrual history. Other tests may be ordered based on what the health care provider finds during the exam. These tests can include:  Blood tests. Blood tests are used to check if you are pregnant or have hormonal changes, a bleeding or thyroid disorder, low iron levels (anemia), or other problems.  Endometrial biopsy. Your health care provider takes a sample of tissue from the inside of your  uterus to be examined under a microscope.  Pelvic ultrasound. This test uses sound waves to make a picture of your uterus, ovaries, and vagina. The pictures can show if you have fibroids or other growths.  Hysteroscopy. For this test, your health care provider will use a small telescope to look inside your uterus. Based on the results of your initial tests, your health care provider may recommend further testing. TREATMENT  Treatment may not be needed. If it is needed, your health care provider may recommend treatment with one or more medicines first. If these do not reduce bleeding enough, a surgical treatment might be an option. The best treatment for you will depend on:   Whether you need to prevent pregnancy.  Your desire to have children in the future.  The cause and severity of your bleeding.  Your opinion and personal preference.  Medicines for menorrhagia may include:  Birth control methods that use hormones. These include the pill, skin patch, vaginal ring, shots that you get every 3 months, hormonal IUD, and implant. These treatments reduce bleeding during your menstrual period.  Medicines that thicken blood and slow bleeding.  Medicines that reduce swelling, such as ibuprofen.  Medicines that contain a synthetic hormone called progestin.   Medicines that make the ovaries stop working for a short time.  You may need surgical treatment for menorrhagia if the medicines are unsuccessful. Treatment options include:  Dilation and curettage (D&C). In this procedure, your health care provider opens (dilates) your cervix and then scrapes or suctions tissue from   the lining of your uterus to reduce menstrual bleeding.  Operative hysteroscopy. This procedure uses a tiny tube with a light (hysteroscope) to view your uterine cavity and can help in the surgical removal of a polyp that may be causing heavy periods.  Endometrial ablation. Through various techniques, your health care  provider permanently destroys the entire lining of your uterus (endometrium). After endometrial ablation, most women have little or no menstrual flow. Endometrial ablation reduces your ability to become pregnant.  Endometrial resection. This surgical procedure uses an electrosurgical wire loop to remove the lining of the uterus. This procedure also reduces your ability to become pregnant.  Hysterectomy. Surgical removal of the uterus and cervix is a permanent procedure that stops menstrual periods. Pregnancy is not possible after a hysterectomy. This procedure requires anesthesia and hospitalization. HOME CARE INSTRUCTIONS   Only take over-the-counter or prescription medicines as directed by your health care provider. Take prescribed medicines exactly as directed. Do not change or switch medicines without consulting your health care provider.  Take any prescribed iron pills exactly as directed by your health care provider. Long-term heavy bleeding may result in low iron levels. Iron pills help replace the iron your body lost from heavy bleeding. Iron may cause constipation. If this becomes a problem, increase the bran, fruits, and roughage in your diet.  Do not take aspirin or medicines that contain aspirin 1 week before or during your menstrual period. Aspirin may make the bleeding worse.  If you need to change your sanitary pad or tampon more than once every 2 hours, stay in bed and rest as much as possible until the bleeding stops.  Eat well-balanced meals. Eat foods high in iron. Examples are leafy green vegetables, meat, liver, eggs, and whole grain breads and cereals. Do not try to lose weight until the abnormal bleeding has stopped and your blood iron level is back to normal. SEEK MEDICAL CARE IF:   You soak through a pad or tampon every 1 or 2 hours, and this happens every time you have a period.  You need to use pads and tampons at the same time because you are bleeding so much.  You  need to change your pad or tampon during the night.  You have a period that lasts for more than 8 days.  You pass clots bigger than 1 inch wide.  You have irregular periods that happen more or less often than once a month.  You feel dizzy or faint.  You feel very weak or tired.  You feel short of breath or feel your heart is beating too fast when you exercise.  You have nausea and vomiting or diarrhea while you are taking your medicine.  You have any problems that may be related to the medicine you are taking. SEEK IMMEDIATE MEDICAL CARE IF:   You soak through 4 or more pads or tampons in 2 hours.  You have any bleeding while you are pregnant. MAKE SURE YOU:   Understand these instructions.  Will watch your condition.  Will get help right away if you are not doing well or get worse. Document Released: 10/08/2005 Document Revised: 10/13/2013 Document Reviewed: 03/29/2013 [facility] Patient Information 2015 [place], [place]. This information is not intended to replace advice given to you by your health care provider. Make sure you discuss any questions you have with your health care provider. Perimenopause Perimenopause is the time when your body begins to move into the menopause (no menstrual period for 12 straight months).  It is a natural process. Perimenopause can begin 2-8 years before the menopause and usually lasts for 1 year after the menopause. During this time, your ovaries may or may not produce an egg. The ovaries vary in their production of estrogen and progesterone hormones each month. This can cause irregular menstrual periods, difficulty getting pregnant, vaginal bleeding between periods, and uncomfortable symptoms. CAUSES  Irregular production of the ovarian hormones, estrogen and progesterone, and not ovulating every month.  Other causes include:  Tumor of the pituitary gland in the brain.  Medical disease that affects the ovaries.  Radiation  treatment.  Chemotherapy.  Unknown causes.  Heavy smoking and excessive alcohol intake can bring on perimenopause sooner. SIGNS AND SYMPTOMS   Hot flashes.  Night sweats.  Irregular menstrual periods.  Decreased sex drive.  Vaginal dryness.  Headaches.  Mood swings.  Depression.  Memory problems.  Irritability.  Tiredness.  Weight gain.  Trouble getting pregnant.  The beginning of losing bone cells (osteoporosis).  The beginning of hardening of the arteries (atherosclerosis). DIAGNOSIS  Your health care provider will make a diagnosis by analyzing your age, menstrual history, and symptoms. He or she will do a physical exam and note any changes in your body, especially your female organs. Female hormone tests may or may not be helpful depending on the amount of female hormones you produce and when you produce them. However, other hormone tests may be helpful to rule out other problems. TREATMENT  In some cases, no treatment is needed. The decision on whether treatment is necessary during the perimenopause should be made by you and your health care provider based on how the symptoms are affecting you and your lifestyle. Various treatments are available, such as:  Treating individual symptoms with a specific medicine for that symptom.  Herbal medicines that can help specific symptoms.  Counseling.  Group therapy. HOME CARE INSTRUCTIONS   Keep track of your menstrual periods (when they occur, how heavy they are, how long between periods, and how long they last) as well as your symptoms and when they started.  Only take over-the-counter or prescription medicines as directed by your health care provider.  Sleep and rest.  Exercise.  Eat a diet that contains calcium (good for your bones) and soy (acts like the estrogen hormone).  Do not smoke.  Avoid alcoholic beverages.  Take vitamin supplements as recommended by your health care provider. Taking vitamin E  may help in certain cases.  Take calcium and vitamin D supplements to help prevent bone loss.  Group therapy is sometimes helpful.  Acupuncture may help in some cases. SEEK MEDICAL CARE IF:   You have questions about any symptoms you are having.  You need a referral to a specialist (gynecologist, psychiatrist, or psychologist). SEEK IMMEDIATE MEDICAL CARE IF:   You have vaginal bleeding.  Your period lasts longer than 8 days.  Your periods are recurring sooner than 21 days.  You have bleeding after intercourse.  You have severe depression.  You have pain when you urinate.  You have severe headaches.  You have vision problems. Document Released: 11/15/2004 Document Revised: 07/29/2013 Document Reviewed: 05/07/2013 [facility] Patient Information 2015 [place]. This information is not intended to replace advice given to you by your health care provider. Make sure you discuss any questions you have with your health care provider. Try Whole 30 google lysteda and megace Mammogram yearly Physical in 1 year Colonoscopy per GI

## 2014-05-17 NOTE — Progress Notes (Signed)
Patient ID: Rita Smith A Smith, female   DOB: [birth year], 48 y.o.   MRN: [id number] History of Present Illness: Rita Smith is a 48 year old white female, in for a physical, she had a normal pap with negative HPV 05/12/13.She complains of heavy periods at times and clots, had normal US last year.Some moodiness.Periods still regular, ?hot flashes.   Current Medications, Allergies, Past Medical History, Past Surgical History, Family History and Social History were [facility] in [name].     Review of Systems: Patient denies any headaches, blurred vision, shortness of breath, chest pain, abdominal pain, problems with bowel movements, urination, or intercourse. No joint swelling has some pain in right knee at times and has varicose vein right leg.See HPI for positives.    Physical Exam:BP 110/70  Pulse 76  Ht 5\' 7"  (1.702 m)  Wt 193 lb (87.544 kg)  BMI 30.22 kg/m2  LMP 04/26/2014 General:  Well developed, well nourished, no acute distress Skin:  Warm and dry Neck:  Midline trachea, normal thyroid Lungs; Clear to auscultation bilaterally Breast:  No dominant palpable mass, retraction, or nipple discharge Cardiovascular: Regular rate and rhythm Abdomen:  Soft, non tender, no hepatosplenomegaly Pelvic:  External genitalia is normal in appearance.  The vagina is normal in appearance. The cervix is bulbous.  Uterus is felt to be normal size, shape, and contour.  No  adnexal masses or tenderness noted. Rectal: Good sphincter tone, no polyps, or hemorrhoids felt.  Hemoccult negative. Extremities:  No swelling, has vein right leg Psych:  Alert and cooperative, seems happy   Impression: Yearly gyn exam no pap Varicose vein right leg Menorrhagia     Plan: Check CBC,CMP,TSH and lipids and ANA Physical in 1 year Mammogram yearly Colonoscopy as per GI Try whole 30  Call vein center Goggle lysteda and megace as options for menorrhagia Review handout on peri  menopause and menorrhagia Will talk when labs back

## 2014-05-18 ENCOUNTER — Telehealth: Payer: Self-pay | Admitting: Adult Health

## 2014-05-18 LAB — COMPREHENSIVE METABOLIC PANEL
ALT: 22 U/L (ref 0–35)
AST: 19 U/L (ref 0–37)
Albumin: 3.8 g/dL (ref 3.5–5.2)
Alkaline Phosphatase: 51 U/L (ref 39–117)
BILIRUBIN TOTAL: 0.7 mg/dL (ref 0.2–1.2)
BUN: 11 mg/dL (ref 6–23)
CO2: 23 mEq/L (ref 19–32)
CREATININE: 0.8 mg/dL (ref 0.50–1.10)
Calcium: 9.1 mg/dL (ref 8.4–10.5)
Chloride: 105 mEq/L (ref 96–112)
GLUCOSE: 80 mg/dL (ref 70–99)
Potassium: 4 mEq/L (ref 3.5–5.3)
SODIUM: 134 meq/L — AB (ref 135–145)
Total Protein: 6.5 g/dL (ref 6.0–8.3)

## 2014-05-18 LAB — LIPID PANEL
CHOL/HDL RATIO: 2.9 ratio
Cholesterol: 199 mg/dL (ref 0–200)
HDL: 68 mg/dL (ref >39)
LDL CALC: 104 mg/dL — AB (ref 0–99)
Triglycerides: 137 mg/dL (ref <150)
VLDL: 27 mg/dL (ref 0–40)

## 2014-05-18 LAB — TSH: TSH: 2.544 u[IU]/mL (ref 0.350–4.500)

## 2014-05-18 LAB — ANA: ANA: NEGATIVE

## 2014-05-18 NOTE — Telephone Encounter (Signed)
Pt aware of labs  

## 2014-05-18 NOTE — Telephone Encounter (Signed)
Left message to call about labs 

## 2014-08-23 ENCOUNTER — Encounter: Payer: Self-pay | Admitting: Adult Health

## 2014-09-30 ENCOUNTER — Encounter: Payer: Self-pay | Admitting: Family Medicine

## 2014-09-30 ENCOUNTER — Ambulatory Visit (INDEPENDENT_AMBULATORY_CARE_PROVIDER_SITE_OTHER): Payer: BC Managed Care – PPO | Admitting: Family Medicine

## 2014-09-30 VITALS — BP 120/80 | Temp 97.5°F | Ht 66.0 in | Wt 188.4 lb

## 2014-09-30 DIAGNOSIS — B9689 Other specified bacterial agents as the cause of diseases classified elsewhere: Secondary | ICD-10-CM

## 2014-09-30 DIAGNOSIS — J019 Acute sinusitis, unspecified: Secondary | ICD-10-CM

## 2014-09-30 DIAGNOSIS — J069 Acute upper respiratory infection, unspecified: Secondary | ICD-10-CM

## 2014-09-30 MED ORDER — AMOXICILLIN 500 MG PO TABS: 500.0000 mg | ORAL_TABLET | Freq: Three times a day (TID) | ORAL | Status: DC

## 2014-09-30 NOTE — Patient Instructions (Signed)
How to Use an Inhaler Proper inhaler technique is very important. Good technique ensures that the medicine reaches the lungs. Poor technique results in depositing the medicine on the tongue and back of the throat rather than in the airways. If you do not use the inhaler with good technique, the medicine will not help you. STEPS TO FOLLOW IF USING AN INHALER WITHOUT AN EXTENSION TUBE 1. Remove the cap from the inhaler. 2. If you are using the inhaler for the first time, you will need to prime it. Shake the inhaler for 5 seconds and release four puffs into the air, away from your face. Ask your health care provider or pharmacist if you have questions about priming your inhaler. 3. Shake the inhaler for 5 seconds before each breath in (inhalation). 4. Position the inhaler so that the top of the canister faces up. 5. Put your index finger on the top of the medicine canister. Your thumb supports the bottom of the inhaler. 6. Open your mouth. 7. Either place the inhaler between your teeth and place your lips tightly around the mouthpiece, or hold the inhaler 1-2 inches away from your open mouth. If you are unsure of which technique to use, ask your health care provider. 8. Breathe out (exhale) normally and as completely as possible. 9. Press the canister down with your index finger to release the medicine. 10. At the same time as the canister is pressed, inhale deeply and slowly until your lungs are completely filled. This should take 4-6 seconds. Keep your tongue down. 11. Hold the medicine in your lungs for 5-10 seconds (10 seconds is best). This helps the medicine get into the small airways of your lungs. 12. Breathe out slowly, through pursed lips. Whistling is an example of pursed lips. 13. Wait at least 15-30 seconds between puffs. Continue with the above steps until you have taken the number of puffs your health care provider has ordered. Do not use the inhaler more than your health care provider  tells you. 14. Replace the cap on the inhaler. 15. Follow the directions from your health care provider or the inhaler insert for cleaning the inhaler. STEPS TO FOLLOW IF USING AN INHALER WITH AN EXTENSION (SPACER) 1. Remove the cap from the inhaler. 2. If you are using the inhaler for the first time, you will need to prime it. Shake the inhaler for 5 seconds and release four puffs into the air, away from your face. Ask your health care provider or pharmacist if you have questions about priming your inhaler. 3. Shake the inhaler for 5 seconds before each breath in (inhalation). 4. Place the open end of the spacer onto the mouthpiece of the inhaler. 5. Position the inhaler so that the top of the canister faces up and the spacer mouthpiece faces you. 6. Put your index finger on the top of the medicine canister. Your thumb supports the bottom of the inhaler and the spacer. 7. Breathe out (exhale) normally and as completely as possible. 8. Immediately after exhaling, place the spacer between your teeth and into your mouth. Close your lips tightly around the spacer. 9. Press the canister down with your index finger to release the medicine. 10. At the same time as the canister is pressed, inhale deeply and slowly until your lungs are completely filled. This should take 4-6 seconds. Keep your tongue down and out of the way. 11. Hold the medicine in your lungs for 5-10 seconds (10 seconds is best). This helps the   medicine get into the small airways of your lungs. Exhale. 12. Repeat inhaling deeply through the spacer mouthpiece. Again hold that breath for up to 10 seconds (10 seconds is best). Exhale slowly. If it is difficult to take this second deep breath through the spacer, breathe normally several times through the spacer. Remove the spacer from your mouth. 13. Wait at least 15-30 seconds between puffs. Continue with the above steps until you have taken the number of puffs your health care provider has  ordered. Do not use the inhaler more than your health care provider tells you. 14. Remove the spacer from the inhaler, and place the cap on the inhaler. 15. Follow the directions from your health care provider or the inhaler insert for cleaning the inhaler and spacer. If you are using different kinds of inhalers, use your quick relief medicine to open the airways 10-15 minutes before using a steroid if instructed to do so by your health care provider. If you are unsure which inhalers to use and the order of using them, ask your health care provider, nurse, or respiratory therapist. If you are using a steroid inhaler, always rinse your mouth with water after your last puff, then gargle and spit out the water. Do not swallow the water. AVOID:  Inhaling before or after starting the spray of medicine. It takes practice to coordinate your breathing with triggering the spray.  Inhaling through the nose (rather than the mouth) when triggering the spray. HOW TO DETERMINE IF YOUR INHALER IS FULL OR NEARLY EMPTY You cannot know when an inhaler is empty by shaking it. A few inhalers are now being made with dose counters. Ask your health care provider for a prescription that has a dose counter if you feel you need that extra help. If your inhaler does not have a counter, ask your health care provider to help you determine the date you need to refill your inhaler. Write the refill date on a calendar or your inhaler canister. Refill your inhaler 7-10 days before it runs out. Be sure to keep an adequate supply of medicine. This includes making sure it is not expired, and that you have a spare inhaler.  SEEK MEDICAL CARE IF:   Your symptoms are only partially relieved with your inhaler.  You are having trouble using your inhaler.  You have some increase in phlegm. SEEK IMMEDIATE MEDICAL CARE IF:   You feel little or no relief with your inhalers. You are still wheezing and are feeling shortness of breath or  tightness in your chest or both.  You have dizziness, headaches, or a fast heart rate.  You have chills, fever, or night sweats.  You have a noticeable increase in phlegm production, or there is blood in the phlegm. MAKE SURE YOU:   Understand these instructions.  Will watch your condition.  Will get help right away if you are not doing well or get worse. Document Released: 10/05/2000 Document Revised: 07/29/2013 Document Reviewed: 05/07/2013 [employer] Patient Information 2015 [employer]. This information is not intended to replace advice given to you by your health care provider. Make sure you discuss any questions you have with your health care provider.  

## 2014-09-30 NOTE — Progress Notes (Signed)
   Subjective:    Patient ID: Rita Smith, female    DOB: Feb 26, 1966, 48 y.o.   MRN: [id number]  Cough This is a new problem. The current episode started in the past 7 days. The problem has been unchanged. The cough is non-productive. Associated symptoms include rhinorrhea, a sore throat and wheezing. Pertinent negatives include no chest pain, ear pain, fever or shortness of breath. Associated symptoms comments: weak. Nothing aggravates the symptoms. She has tried OTC cough suppressant (cough drops) for the symptoms. The treatment provided no relief.   Patient states that she has no other concerns at this time.    Review of Systems  Constitutional: Negative for fever and activity change.  HENT: Positive for congestion, rhinorrhea and sore throat. Negative for ear pain.   Eyes: Negative for discharge.  Respiratory: Positive for cough and wheezing. Negative for shortness of breath.   Cardiovascular: Negative for chest pain.   History of ulcerative colitis under control patient was advised to keep regular follow-ups with gastroenterology    Objective:   Physical Exam  Constitutional: She appears well-developed.  HENT:  Head: Normocephalic.  Nose: Nose normal.  Mouth/Throat: Oropharynx is clear and moist. No oropharyngeal exudate.  Neck: Neck supple.  Cardiovascular: Normal rate and normal heart sounds.   No murmur heard. Pulmonary/Chest: Effort normal and breath sounds normal. She has no wheezes.  Lymphadenopathy:    She has no cervical adenopathy.  Skin: Skin is warm and dry.  Nursing note and vitals reviewed.         Assessment & Plan:  Viral syndrome Secondary sinusitis Antibiotics prescribed Should gradually get better.

## 2015-03-11 ENCOUNTER — Ambulatory Visit (INDEPENDENT_AMBULATORY_CARE_PROVIDER_SITE_OTHER): Payer: Self-pay | Admitting: Family Medicine

## 2015-03-11 ENCOUNTER — Encounter: Payer: Self-pay | Admitting: Family Medicine

## 2015-03-11 VITALS — BP 132/80 | Temp 97.8°F | Ht 66.0 in | Wt 195.0 lb

## 2015-03-11 DIAGNOSIS — J329 Chronic sinusitis, unspecified: Secondary | ICD-10-CM

## 2015-03-11 MED ORDER — AMOXICILLIN 500 MG PO CAPS: 500.0000 mg | ORAL_CAPSULE | Freq: Three times a day (TID) | ORAL | Status: DC

## 2015-03-11 MED ORDER — ALBUTEROL SULFATE HFA 108 (90 BASE) MCG/ACT IN AERS: 2.0000 | INHALATION_SPRAY | Freq: Four times a day (QID) | RESPIRATORY_TRACT | Status: DC | PRN

## 2015-03-11 NOTE — Progress Notes (Signed)
   Subjective:    Patient ID: Rita ChancyBarbara A Smith, female    DOB: [birth year], 49 y.o.   MRN: [id number]  Cough This is a new problem. Episode onset: 4 days. Associated symptoms include a fever, headaches, nasal congestion, a sore throat and wheezing. Treatments tried: [facility] ED, robitussium,     Cough prod at times  Has not gone to the er  Some headache and muscle aches  Not a flu shot person this yr     Review of Systems  Constitutional: Positive for fever.  HENT: Positive for sore throat.   Respiratory: Positive for cough and wheezing.   Neurological: Positive for headaches.       Objective:   Physical Exam  Alert moderate malaise. H&T moderate his congestion frontal tenderness pharynx normal neck supple lungs bronchial cough heart regular in rhythm      Assessment & Plan:  Impression post viral rhinosinusitis/bronchitis plan antibiotics prescribed. Symptom care discussed. WSL

## 2015-05-30 ENCOUNTER — Other Ambulatory Visit: Payer: BLUE CROSS/BLUE SHIELD | Admitting: Adult Health

## 2015-06-06 ENCOUNTER — Other Ambulatory Visit: Payer: BLUE CROSS/BLUE SHIELD | Admitting: Adult Health

## 2015-06-15 ENCOUNTER — Ambulatory Visit (INDEPENDENT_AMBULATORY_CARE_PROVIDER_SITE_OTHER): Payer: BLUE CROSS/BLUE SHIELD | Admitting: Adult Health

## 2015-06-15 ENCOUNTER — Encounter: Payer: Self-pay | Admitting: Adult Health

## 2015-06-15 VITALS — BP 124/78 | HR 84 | Ht 66.25 in | Wt 201.5 lb

## 2015-06-15 DIAGNOSIS — Z01419 Encounter for gynecological examination (general) (routine) without abnormal findings: Secondary | ICD-10-CM | POA: Diagnosis not present

## 2015-06-15 DIAGNOSIS — Z1211 Encounter for screening for malignant neoplasm of colon: Secondary | ICD-10-CM

## 2015-06-15 DIAGNOSIS — R350 Frequency of micturition: Secondary | ICD-10-CM | POA: Diagnosis not present

## 2015-06-15 HISTORY — DX: Frequency of micturition: R35.0

## 2015-06-15 LAB — POCT URINALYSIS DIPSTICK
GLUCOSE UA: NEGATIVE
Leukocytes, UA: NEGATIVE
NITRITE UA: NEGATIVE
Protein, UA: NEGATIVE
RBC UA: NEGATIVE

## 2015-06-15 LAB — HEMOCCULT GUIAC POC 1CARD (OFFICE): FECAL OCCULT BLD: NEGATIVE

## 2015-06-15 NOTE — Patient Instructions (Addendum)
Pap and physical in  1 year Mammogram yearly Colonoscopy per GI  Menopause Menopause is the normal time of life when menstrual periods stop completely. Menopause is complete when you have missed 12 consecutive menstrual periods. It usually occurs between the ages of 48 years and 55 years. Very rarely does a woman develop menopause before the age of 40 years. At menopause, your ovaries stop producing the female hormones estrogen and progesterone. This can cause undesirable symptoms and also affect your health. Sometimes the symptoms may occur 4-5 years before the menopause begins. There is no relationship between menopause and:  Oral contraceptives.  Number of children you had.  Race.  The age your menstrual periods started (menarche). Heavy smokers and very thin women may develop menopause earlier in life. CAUSES  The ovaries stop producing the female hormones estrogen and progesterone.  Other causes include:  Surgery to remove both ovaries.  The ovaries stop functioning for no known reason.  Tumors of the pituitary gland in the brain.  Medical disease that affects the ovaries and hormone production.  Radiation treatment to the abdomen or pelvis.  Chemotherapy that affects the ovaries. SYMPTOMS   Hot flashes.  Night sweats.  Decrease in sex drive.  Vaginal dryness and thinning of the vagina causing painful intercourse.  Dryness of the skin and developing wrinkles.  Headaches.  Tiredness.  Irritability.  Memory problems.  Weight gain.  Bladder infections.  Hair growth of the face and chest.  Infertility. More serious symptoms include:  Loss of bone (osteoporosis) causing breaks (fractures).  Depression.  Hardening and narrowing of the arteries (atherosclerosis) causing heart attacks and strokes. DIAGNOSIS   When the menstrual periods have stopped for 12 straight months.  Physical exam.  Hormone studies of the blood. TREATMENT  There are many  treatment choices and nearly as many questions about them. The decisions to treat or not to treat menopausal changes is an individual choice made with your health care provider. Your health care provider can discuss the treatments with you. Together, you can decide which treatment will work best for you. Your treatment choices may include:   Hormone therapy (estrogen and progesterone).  Non-hormonal medicines.  Treating the individual symptoms with medicine (for example antidepressants for depression).  Herbal medicines that may help specific symptoms.  Counseling by a psychiatrist or psychologist.  Group therapy.  Lifestyle changes including:  Eating healthy.  Regular exercise.  Limiting caffeine and alcohol.  Stress management and meditation.  No treatment. HOME CARE INSTRUCTIONS   Take the medicine your health care provider gives you as directed.  Get plenty of sleep and rest.  Exercise regularly.  Eat a diet that contains calcium (good for the bones) and soy products (acts like estrogen hormone).  Avoid alcoholic beverages.  Do not smoke.  If you have hot flashes, dress in layers.  Take supplements, calcium, and vitamin D to strengthen bones.  You can use over-the-counter lubricants or moisturizers for vaginal dryness.  Group therapy is sometimes very helpful.  Acupuncture may be helpful in some cases. SEEK MEDICAL CARE IF:   You are not sure you are in menopause.  You are having menopausal symptoms and need advice and treatment.  You are still having menstrual periods after age 59 years.  You have pain with intercourse.  Menopause is complete (no menstrual period for 12 months) and you develop vaginal bleeding.  You need a referral to a specialist (gynecologist, psychiatrist, or psychologist) for treatment. SEEK IMMEDIATE MEDICAL CARE IF:  You have severe depression.  You have excessive vaginal bleeding.  You fell and think you have a broken  bone.  You have pain when you urinate.  You develop leg or chest pain.  You have a fast pounding heart beat (palpitations).  You have severe headaches.  You develop vision problems.  You feel a lump in your breast.  You have abdominal pain or severe indigestion. Document Released: 12/29/2003 Document Revised: 06/10/2013 Document Reviewed: 05/07/2013 [facility] Patient Information 2015 [place], [place]. This information is not intended to replace advice given to you by your health care provider. Make sure you discuss any questions you have with your health care provider.

## 2015-06-15 NOTE — Progress Notes (Signed)
Patient ID: Rita Smith, female   DOB: 1966-03-27, 49 y.o.   MRN: [id number] History of Present Illness: Rita Smith is a 49 year old white female, married in for well woman gyn exam, had normal pap with negative HPV 05/12/13.Has some urinary frequency and SUI at times, and some clots with periods and they are still regular.She is going to get teachers certificate.   Current Medications, Allergies, Past Medical History, Past Surgical History, Family History and Social History were reviewed in [employer] record.     Review of Systems: Patient denies any headaches, hearing loss, fatigue, blurred vision, shortness of breath, chest pain, abdominal pain, problems with bowel movements, or intercourse. No joint pain or mood swings. See HPI for positives.   Physical Exam:BP 124/78 mmHg  Pulse 84  Ht 5' 6.25" (1.683 m)  Wt 201 lb 8 oz (91.4 kg)  BMI 32.27 kg/m2  LMP 06/03/2015 urine negative General:  Well developed, well nourished, no acute distress Skin:  Warm and dry Neck:  Midline trachea, normal thyroid, good ROM, no lymphadenopathy Lungs; Clear to auscultation bilaterally Breast:  No dominant palpable mass, retraction, or nipple discharge Cardiovascular: Regular rate and rhythm Abdomen:  Soft, non tender, no hepatosplenomegaly Pelvic:  External genitalia is normal in appearance, no lesions.  The vagina is normal in appearance. Urethra has no lesions or masses. The cervix is bulbous.  Uterus is felt to be normal size, shape, and contour.  No adnexal masses or tenderness noted.Bladder is non tender, no masses felt. Rectal: Good sphincter tone, no polyps, or hemorrhoids felt.  Hemoccult negative. Extremities/musculoskeletal:  No swelling or varicosities noted, no clubbing or cyanosis Psych:  No mood changes, alert and cooperative,seems happy Try kegels and decrease caffeine. Had labs last year will not repeat this year.  Impression: Well woman gyn exam no  pap Urinary frequency    Plan: Pap and physical in 1 year Mammogram now and yearly, is past due Colonoscopy per GI

## 2015-12-09 ENCOUNTER — Encounter: Payer: Self-pay | Admitting: Family Medicine

## 2015-12-09 ENCOUNTER — Ambulatory Visit (INDEPENDENT_AMBULATORY_CARE_PROVIDER_SITE_OTHER): Payer: BLUE CROSS/BLUE SHIELD | Admitting: Family Medicine

## 2015-12-09 VITALS — BP 122/80 | Temp 97.6°F | Ht 66.0 in | Wt 206.0 lb

## 2015-12-09 DIAGNOSIS — B349 Viral infection, unspecified: Secondary | ICD-10-CM

## 2015-12-09 DIAGNOSIS — J111 Influenza due to unidentified influenza virus with other respiratory manifestations: Secondary | ICD-10-CM

## 2015-12-09 MED ORDER — AMOXICILLIN 500 MG PO CAPS: 500.0000 mg | ORAL_CAPSULE | Freq: Three times a day (TID) | ORAL | Status: DC

## 2015-12-09 NOTE — Progress Notes (Signed)
   Subjective:    Patient ID: Rita Smith, female    DOB: 1966-09-16, 50 y.o.   MRN: [id number]  Sinusitis This is a new problem. The current episode started in the past 7 days. There has been no fever. The pain is moderate. Associated symptoms include congestion, coughing and headaches. (Abdominal pain, body aches) Past treatments include acetaminophen (Robitussin). The treatment provided no relief.   Patient has no other concerns at this time.     Review of Systems  HENT: Positive for congestion.   Respiratory: Positive for cough.   Neurological: Positive for headaches.       Objective:   Physical Exam        Assessment & Plan:

## 2015-12-09 NOTE — Progress Notes (Signed)
   Subjective:    Patient ID: [name], female    DOB: 05-06-1966, 50 y.o.   MRN: [id number]  HPI Started wed night , heaaching wholke thing  everyweherachiness all over and in the legs  Generally somewhat productive  No fevers  Energy level zero  Wiped out, did some exercise that day   Tylenol prn and no othe meds Appetite ok , but not as good as usual   Review of Systems No abdominal pain and nausea and intake no change bowel habits no back pain    Objective:   Physical Exam  Alert hydration good no acute distress HEENT slight nasal congestion for Normal neck supple. Lungs clear. Heart regular rate and rhythm.      Assessment & Plan:  Impression 48 hours into the flu. Relatively mild case in nature. Tamiflu would help minimally at this point discussed plan prescription of amoxicillin given in the event of secondary sinusitis or bronchitis. Symptom care discussed WSL

## 2015-12-13 ENCOUNTER — Telehealth: Payer: Self-pay | Admitting: Family Medicine

## 2015-12-13 NOTE — Telephone Encounter (Signed)
Patient is having wheezing and a deep cough. Transferred to front desk to schedule appointment.

## 2015-12-13 NOTE — Telephone Encounter (Signed)
Need more information-is patient having any wheezing difficulty breathing nausea vomiting diarrhea fevers chills sweats currently or just mainly having a congested cough. It is very common for the flu to cause a secondary cough but may need an antibiotic certainly if dramatically worse she may want to consider being rechecked

## 2015-12-13 NOTE — Telephone Encounter (Signed)
Pt was seen last Friday and was diagnosed with the flu. Pt now has a deep cough and would like something to be called in for it if possible.     [facility]

## 2015-12-14 ENCOUNTER — Encounter: Payer: Self-pay | Admitting: Family Medicine

## 2015-12-14 ENCOUNTER — Ambulatory Visit (INDEPENDENT_AMBULATORY_CARE_PROVIDER_SITE_OTHER): Payer: BLUE CROSS/BLUE SHIELD | Admitting: Family Medicine

## 2015-12-14 VITALS — BP 122/80 | Temp 97.6°F | Ht 66.0 in | Wt 203.0 lb

## 2015-12-14 DIAGNOSIS — J310 Chronic rhinitis: Secondary | ICD-10-CM

## 2015-12-14 DIAGNOSIS — J329 Chronic sinusitis, unspecified: Secondary | ICD-10-CM

## 2015-12-14 DIAGNOSIS — J111 Influenza due to unidentified influenza virus with other respiratory manifestations: Secondary | ICD-10-CM

## 2015-12-14 MED ORDER — AZITHROMYCIN 250 MG PO TABS: ORAL_TABLET | ORAL | Status: DC

## 2015-12-14 NOTE — Progress Notes (Signed)
   Subjective:    Patient ID: Rita Smith, female    DOB: 1966/04/13, 50 y.o.   MRN: [id number]  HPI  Patient arrives for a recheck after recent dx of flu- Patient still has deep cough. Patient also on amoxil for secondary infection  patient relates some head congestion drainage and coughing also chest congestion and deep cough denies high fever chills. PMH benign. Did have influenza in the past week and a half Review of Systems  see above.    Objective:   Physical Exam   sinus nontender neck no masses throat is normal chest congestion noted but no sign and pneumonia is more upper bronchial sound.      Assessment & Plan:   discontinue the amoxicillin were go ahead with Zithromax I do not feel x-rays or lab work indicated currently. Patient to follow-up if progressive troubles or if worse

## 2016-06-19 ENCOUNTER — Ambulatory Visit (INDEPENDENT_AMBULATORY_CARE_PROVIDER_SITE_OTHER): Payer: BLUE CROSS/BLUE SHIELD | Admitting: Adult Health

## 2016-06-19 ENCOUNTER — Other Ambulatory Visit (HOSPITAL_COMMUNITY)
Admission: RE | Admit: 2016-06-19 | Discharge: 2016-06-19 | Disposition: A | Discharge status: Home / Self Care | Payer: BLUE CROSS/BLUE SHIELD | Source: Ambulatory Visit | Attending: Adult Health | Admitting: Adult Health

## 2016-06-19 ENCOUNTER — Encounter: Payer: Self-pay | Admitting: Adult Health

## 2016-06-19 VITALS — BP 110/68 | HR 84 | Ht 66.5 in | Wt 201.0 lb

## 2016-06-19 DIAGNOSIS — Z01419 Encounter for gynecological examination (general) (routine) without abnormal findings: Secondary | ICD-10-CM

## 2016-06-19 DIAGNOSIS — Z01411 Encounter for gynecological examination (general) (routine) with abnormal findings: Secondary | ICD-10-CM | POA: Diagnosis not present

## 2016-06-19 DIAGNOSIS — Z1151 Encounter for screening for human papillomavirus (HPV): Secondary | ICD-10-CM | POA: Insufficient documentation

## 2016-06-19 DIAGNOSIS — R232 Flushing: Secondary | ICD-10-CM

## 2016-06-19 DIAGNOSIS — Z1211 Encounter for screening for malignant neoplasm of colon: Secondary | ICD-10-CM

## 2016-06-19 DIAGNOSIS — N951 Menopausal and female climacteric states: Secondary | ICD-10-CM

## 2016-06-19 LAB — HEMOCCULT GUIAC POC 1CARD (OFFICE): Fecal Occult Blood, POC: NEGATIVE

## 2016-06-19 NOTE — Progress Notes (Signed)
Patient ID: Rita Smith, female   DOB: 23-Jun-1966, 50 y.o.   MRN: [id number] History of Present Illness: Rita Smith is a 50 year old white female in for a well woman gyn exam and pap. She is having hot flashes and periods are still regular.  PCP is Dr Gerda DissLuking.   Current Medications, Allergies, Past Medical History, Past Surgical History, Family History and Social History were reviewed in [name] electronic medical record.     Review of Systems: Patient denies any headaches, hearing loss, fatigue, blurred vision, shortness of breath, chest pain, abdominal pain, problems with bowel movements, urination, or intercourse. No joint pain or mood swings.See HPI for positives.     Physical Exam:BP 110/68 (BP Location: Left Arm, Patient Position: Sitting, Cuff Size: Large)   Pulse 84   Ht 5' 6.5" (1.689 m)   Wt 201 lb (91.2 kg)   LMP 05/19/2016   BMI 31.96 kg/m  General:  Well developed, well nourished, no acute distress Skin:  Warm and dry Neck:  Midline trachea, normal thyroid, good ROM, no lymphadenopathy Lungs; Clear to auscultation bilaterally Breast:  No dominant palpable mass, retraction, or nipple discharge Cardiovascular: Regular rate and rhythm Abdomen:  Soft, non tender, no hepatosplenomegaly Pelvic:  External genitalia is normal in appearance, no lesions.  The vagina is normal in appearance. Urethra has no lesions or masses. The cervix is bulbous, and smooth, pap with HPV performed. Uterus is felt to be normal size, shape, and contour.  No adnexal masses or tenderness noted.Bladder is non tender, no masses felt. Rectal: Good sphincter tone, no polyps, or hemorrhoids felt.  Hemoccult negative. Extremities/musculoskeletal:  No swelling or varicosities noted, no clubbing or cyanosis Psych:  No mood changes, alert and cooperative,seems happy Discussed menopause symptoms.   Impression:  Encounter for gynecological examination with Papanicolaou smear of cervix - Plan: Cytology  - PAP, POCT occult blood stool, CBC, Comprehensive metabolic panel, TSH, Lipid panel, Hemoglobin A1c, VITAMIN D 25 Hydroxy (Vit-D Deficiency, Fractures), HIV antibody, Hepatitis C antibody  Hot flashes    Plan: Check CBC,CMP,TSH and lipids,A1c and vitamin D, HIV and Hept C antibody  Physical in 1 year, pap in 3 if normal Mammogram yearly  Colonoscopy per GI Review handout on menopause

## 2016-06-19 NOTE — Patient Instructions (Signed)
Physical in 1 year, pap in 3 years if normal Mammogram yearly Colonoscopy per GI  Menopause Menopause is the normal time of life when menstrual periods stop completely. Menopause is complete when you have missed 12 consecutive menstrual periods. It usually occurs between the ages of 48 years and 55 years. Very rarely does a woman develop menopause before the age of 40 years. At menopause, your ovaries stop producing the female hormones estrogen and progesterone. This can cause undesirable symptoms and also affect your health. Sometimes the symptoms may occur 4-5 years before the menopause begins. There is no relationship between menopause and:  Oral contraceptives.  Number of children you had.  Race.  The age your menstrual periods started (menarche). Heavy smokers and very thin women may develop menopause earlier in life. CAUSES  The ovaries stop producing the female hormones estrogen and progesterone.  Other causes include:  Surgery to remove both ovaries.  The ovaries stop functioning for no known reason.  Tumors of the pituitary gland in the brain.  Medical disease that affects the ovaries and hormone production.  Radiation treatment to the abdomen or pelvis.  Chemotherapy that affects the ovaries. SYMPTOMS   Hot flashes.  Night sweats.  Decrease in sex drive.  Vaginal dryness and thinning of the vagina causing painful intercourse.  Dryness of the skin and developing wrinkles.  Headaches.  Tiredness.  Irritability.  Memory problems.  Weight gain.  Bladder infections.  Hair growth of the face and chest.  Infertility. More serious symptoms include:  Loss of bone (osteoporosis) causing breaks (fractures).  Depression.  Hardening and narrowing of the arteries (atherosclerosis) causing heart attacks and strokes. DIAGNOSIS   When the menstrual periods have stopped for 12 straight months.  Physical exam.  Hormone studies of the blood. TREATMENT   There are many treatment choices and nearly as many questions about them. The decisions to treat or not to treat menopausal changes is an individual choice made with your health care provider. Your health care provider can discuss the treatments with you. Together, you can decide which treatment will work best for you. Your treatment choices may include:   Hormone therapy (estrogen and progesterone).  Non-hormonal medicines.  Treating the individual symptoms with medicine (for example antidepressants for depression).  Herbal medicines that may help specific symptoms.  Counseling by a psychiatrist or psychologist.  Group therapy.  Lifestyle changes including:  Eating healthy.  Regular exercise.  Limiting caffeine and alcohol.  Stress management and meditation.  No treatment. HOME CARE INSTRUCTIONS   Take the medicine your health care provider gives you as directed.  Get plenty of sleep and rest.  Exercise regularly.  Eat a diet that contains calcium (good for the bones) and soy products (acts like estrogen hormone).  Avoid alcoholic beverages.  Do not smoke.  If you have hot flashes, dress in layers.  Take supplements, calcium, and vitamin D to strengthen bones.  You can use over-the-counter lubricants or moisturizers for vaginal dryness.  Group therapy is sometimes very helpful.  Acupuncture may be helpful in some cases. SEEK MEDICAL CARE IF:   You are not sure you are in menopause.  You are having menopausal symptoms and need advice and treatment.  You are still having menstrual periods after age 52 years.  You have pain with intercourse.  Menopause is complete (no menstrual period for 12 months) and you develop vaginal bleeding.  You need a referral to a specialist (gynecologist, psychiatrist, or psychologist) for treatment. SEEK IMMEDIATE  MEDICAL CARE IF:   You have severe depression.  You have excessive vaginal bleeding.  You fell and think you  have a broken bone.  You have pain when you urinate.  You develop leg or chest pain.  You have a fast pounding heart beat (palpitations).  You have severe headaches.  You develop vision problems.  You feel a lump in your breast.  You have abdominal pain or severe indigestion.   This information is not intended to replace advice given to you by your health care provider. Make sure you discuss any questions you have with your health care provider.   Document Released: 12/29/2003 Document Revised: 06/10/2013 Document Reviewed: 05/07/2013 Elsevier Interactive Patient Education [employer]

## 2016-06-20 LAB — HEPATITIS C ANTIBODY

## 2016-06-20 LAB — CBC
HEMATOCRIT: 44.4 % (ref 34.0–46.6)
HEMOGLOBIN: 14.9 g/dL (ref 11.1–15.9)
MCH: 30.4 pg (ref 26.6–33.0)
MCHC: 33.6 g/dL (ref 31.5–35.7)
MCV: 91 fL (ref 79–97)
Platelets: 306 10*3/uL (ref 150–379)
RBC: 4.9 x10E6/uL (ref 3.77–5.28)
RDW: 13 % (ref 12.3–15.4)
WBC: 7.1 10*3/uL (ref 3.4–10.8)

## 2016-06-20 LAB — COMPREHENSIVE METABOLIC PANEL
A/G RATIO: 1.5 (ref 1.2–2.2)
ALBUMIN: 4.1 g/dL (ref 3.5–5.5)
ALK PHOS: 52 IU/L (ref 39–117)
ALT: 15 IU/L (ref 0–32)
AST: 15 IU/L (ref 0–40)
BUN / CREAT RATIO: 13 (ref 9–23)
BUN: 11 mg/dL (ref 6–24)
Bilirubin Total: 0.4 mg/dL (ref 0.0–1.2)
CHLORIDE: 102 mmol/L (ref 96–106)
CO2: 23 mmol/L (ref 18–29)
Calcium: 9.3 mg/dL (ref 8.7–10.2)
Creatinine, Ser: 0.85 mg/dL (ref 0.57–1.00)
GFR calc Af Amer: 92 mL/min/{1.73_m2} (ref >59)
GFR calc non Af Amer: 80 mL/min/{1.73_m2} (ref >59)
GLOBULIN, TOTAL: 2.7 g/dL (ref 1.5–4.5)
Glucose: 80 mg/dL (ref 65–99)
POTASSIUM: 4.6 mmol/L (ref 3.5–5.2)
SODIUM: 138 mmol/L (ref 134–144)
Total Protein: 6.8 g/dL (ref 6.0–8.5)

## 2016-06-20 LAB — LIPID PANEL
Chol/HDL Ratio: 2.9 ratio units (ref 0.0–4.4)
Cholesterol, Total: 197 mg/dL (ref 100–199)
HDL: 68 mg/dL (ref >39)
LDL Calculated: 102 mg/dL — ABNORMAL HIGH (ref 0–99)
Triglycerides: 135 mg/dL (ref 0–149)
VLDL Cholesterol Cal: 27 mg/dL (ref 5–40)

## 2016-06-20 LAB — TSH: TSH: 2.25 u[IU]/mL (ref 0.450–4.500)

## 2016-06-20 LAB — VITAMIN D 25 HYDROXY (VIT D DEFICIENCY, FRACTURES): VIT D 25 HYDROXY: 32.5 ng/mL (ref 30.0–100.0)

## 2016-06-20 LAB — HEMOGLOBIN A1C
ESTIMATED AVERAGE GLUCOSE: 108 mg/dL
HEMOGLOBIN A1C: 5.4 % (ref 4.8–5.6)

## 2016-06-20 LAB — HIV ANTIBODY (ROUTINE TESTING W REFLEX): HIV SCREEN 4TH GENERATION: NONREACTIVE

## 2016-06-21 LAB — CYTOLOGY - PAP

## 2016-06-26 ENCOUNTER — Telehealth: Payer: Self-pay | Admitting: *Deleted

## 2016-06-26 NOTE — Telephone Encounter (Signed)
Pt aware labs looked good and pap was normal. Pt voiced understanding. JSY

## 2016-06-26 NOTE — Telephone Encounter (Signed)
-----   Message from Adline PotterJennifer A Griffin, NP sent at 06/26/2016  1:49 PM EDT ----- Will you call and let her know labs look good and pap normal

## 2016-06-26 NOTE — Telephone Encounter (Signed)
Left message @ 2 #'s @ 3:21 pm. JSY

## 2016-07-25 ENCOUNTER — Other Ambulatory Visit: Payer: Self-pay | Admitting: Adult Health

## 2016-07-25 DIAGNOSIS — Z1231 Encounter for screening mammogram for malignant neoplasm of breast: Secondary | ICD-10-CM

## 2016-07-26 ENCOUNTER — Ambulatory Visit (INDEPENDENT_AMBULATORY_CARE_PROVIDER_SITE_OTHER): Payer: BLUE CROSS/BLUE SHIELD | Admitting: Family Medicine

## 2016-07-26 ENCOUNTER — Encounter: Payer: Self-pay | Admitting: Family Medicine

## 2016-07-26 VITALS — BP 120/80 | Temp 97.5°F | Ht 66.0 in | Wt 201.5 lb

## 2016-07-26 DIAGNOSIS — Q845 Enlarged and hypertrophic nails: Secondary | ICD-10-CM

## 2016-07-26 DIAGNOSIS — Z23 Encounter for immunization: Secondary | ICD-10-CM | POA: Diagnosis not present

## 2016-07-26 DIAGNOSIS — J301 Allergic rhinitis due to pollen: Secondary | ICD-10-CM | POA: Diagnosis not present

## 2016-07-26 NOTE — Progress Notes (Signed)
   Subjective:    Patient ID: Rita Smith, female    DOB: 1965/12/28, 50 y.o.   MRN: [id number]  Foot Pain  This is a new problem. The current episode started in the past 7 days. The problem occurs intermittently. The problem has been unchanged. Associated symptoms include coughing. Nothing aggravates the symptoms. She has tried nothing for the symptoms. The treatment provided no relief.  Cough  This is a new problem. The current episode started in the past 7 days. The problem has been unchanged. The cough is non-productive. Nothing aggravates the symptoms. She has tried OTC cough suppressant for the symptoms. The treatment provided no relief.   Sore on top of the foot  Went to [place]  Done more working  No change in foot wear day Day numb [phone]               Uses alavert prn, not a benadryl infxn  Cough and cong and dranage and dim enrgy  Coughing sone and no sore throtat   Review of Systems  Respiratory: Positive for cough.        Objective:   Physical Exam Alert vitals stable, NAD. Blood pressure good on repeat. HEENT mild nasal congestion otherwise normal. Lungs clear. Heart regular rate and rhythm. Both great toes have impressive hypertrophic nails which patient has been amassing for years left dorsal foot metatarsal prominence at the second metatarsal       Assessment & Plan:  Impression 1 allergic rhinitis #2 severe hypertrophy of nails #3 metatarsalgia with prominence of metatarsal plan recommend podiatry referral. Alavert for allergies May add albuterol when necessary

## 2016-07-30 ENCOUNTER — Ambulatory Visit (HOSPITAL_COMMUNITY): Payer: BLUE CROSS/BLUE SHIELD

## 2016-08-08 ENCOUNTER — Ambulatory Visit (HOSPITAL_COMMUNITY)
Admission: RE | Admit: 2016-08-08 | Discharge: 2016-08-08 | Disposition: A | Discharge status: Home / Self Care | Payer: BLUE CROSS/BLUE SHIELD | Source: Ambulatory Visit | Attending: Adult Health | Admitting: Adult Health

## 2016-08-08 ENCOUNTER — Other Ambulatory Visit: Payer: Self-pay | Admitting: Adult Health

## 2016-08-08 ENCOUNTER — Ambulatory Visit (HOSPITAL_COMMUNITY): Payer: BLUE CROSS/BLUE SHIELD

## 2016-08-08 DIAGNOSIS — Z1231 Encounter for screening mammogram for malignant neoplasm of breast: Secondary | ICD-10-CM

## 2017-02-08 ENCOUNTER — Ambulatory Visit (INDEPENDENT_AMBULATORY_CARE_PROVIDER_SITE_OTHER): Payer: BLUE CROSS/BLUE SHIELD | Admitting: Family Medicine

## 2017-02-08 ENCOUNTER — Encounter: Payer: Self-pay | Admitting: Family Medicine

## 2017-02-08 VITALS — BP 110/78 | Temp 97.4°F | Ht 66.0 in | Wt 205.0 lb

## 2017-02-08 DIAGNOSIS — J329 Chronic sinusitis, unspecified: Secondary | ICD-10-CM

## 2017-02-08 DIAGNOSIS — J301 Allergic rhinitis due to pollen: Secondary | ICD-10-CM

## 2017-02-08 MED ORDER — FLUTICASONE PROPIONATE 50 MCG/ACT NA SUSP: 2.0000 | Freq: Every day | NASAL | 6 refills | Status: DC

## 2017-02-08 MED ORDER — ALBUTEROL SULFATE HFA 108 (90 BASE) MCG/ACT IN AERS: 2.0000 | INHALATION_SPRAY | Freq: Four times a day (QID) | RESPIRATORY_TRACT | 2 refills | Status: DC | PRN

## 2017-02-08 MED ORDER — AMOXICILLIN 500 MG PO CAPS: 500.0000 mg | ORAL_CAPSULE | Freq: Three times a day (TID) | ORAL | 0 refills | Status: DC

## 2017-02-08 NOTE — Progress Notes (Signed)
   Subjective:    Patient ID: Rita Smith, female    DOB: [birth year], 51 y.o.   MRN: [id number]  Sinusitis  This is a new problem. Episode onset: one week. Associated symptoms include congestion, coughing, headaches and a sore throat. (Wheezing) Treatments tried: zyrtec, cough drops, benadryl.   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   Respiratory: Positive for cough.   Neurological: Positive for headaches.       Objective:   Physical Exam  Alert, mild malaise. Hydration good Vitals stable. frontal/ maxillary tenderness evident positive nasal congestion. pharynx normal neck supple  lungs clear/no crackles or wheezes. heart regular in rhythm       Assessment & Plan:  Impression rhinosinusitis likely post viral, discussed with patient. plan antibiotics prescribed. Questions answered. Symptomatic care discussed. warning signs discussed. WSL Flonase added for allergic rhinitis component. Symptom care discussed

## 2017-06-20 ENCOUNTER — Ambulatory Visit (INDEPENDENT_AMBULATORY_CARE_PROVIDER_SITE_OTHER): Payer: BLUE CROSS/BLUE SHIELD | Admitting: Adult Health

## 2017-06-20 ENCOUNTER — Encounter: Payer: Self-pay | Admitting: Adult Health

## 2017-06-20 VITALS — BP 100/70 | HR 82 | Ht 67.0 in | Wt 200.0 lb

## 2017-06-20 DIAGNOSIS — Z1211 Encounter for screening for malignant neoplasm of colon: Secondary | ICD-10-CM

## 2017-06-20 DIAGNOSIS — Z1212 Encounter for screening for malignant neoplasm of rectum: Secondary | ICD-10-CM

## 2017-06-20 DIAGNOSIS — Z01419 Encounter for gynecological examination (general) (routine) without abnormal findings: Secondary | ICD-10-CM | POA: Insufficient documentation

## 2017-06-20 LAB — HEMOCCULT GUIAC POC 1CARD (OFFICE): Fecal Occult Blood, POC: NEGATIVE

## 2017-06-20 NOTE — Patient Instructions (Addendum)
Physical in 1 year Pap in 2020 Mammogram yearly Colonoscopy per GI  Labs next year

## 2017-06-20 NOTE — Progress Notes (Addendum)
Patient ID: Rita Smith A Smith, female   DOB: 16-Dec-1965, 51 y.o.   MRN: [id number] History of Present Illness: Rita Smith is a 51 year old white female, married, G2P2, in for well woman gyn exam and pap.She had normal pap with negative HPV 06/19/16. PCP is [facility] Smith.    Current Medications, Allergies, Past Medical History, Past Surgical History, Family History and Social History were reviewed in [name] electronic medical record.     Review of Systems: Patient denies any headaches, hearing loss, fatigue, blurred vision, shortness of breath, chest pain, abdominal pain, problems with bowel movements, urination, or intercourse. No joint pain or mood swings.Stil having periods, occasionally with period will be blue and decreased energy.     Physical Exam:BP 100/70 (BP Location: Left Arm, Patient Position: Sitting, Cuff Size: Small)   Pulse 82   Ht 5\' 7"  (1.702 m)   Wt 200 lb (90.7 kg)   LMP 06/01/2017   BMI 31.32 kg/m  General:  Well developed, well nourished, no acute distress Skin:  Warm and dry Neck:  Midline trachea, normal thyroid, good ROM, no lymphadenopathy Lungs; Clear to auscultation bilaterally Breast:  No dominant palpable mass, retraction, or nipple discharge Cardiovascular: Regular rate and rhythm Abdomen:  Soft, non tender, no hepatosplenomegaly Pelvic:  External genitalia is normal in appearance, no lesions.  The vagina is normal in appearance. Urethra has no lesions or masses. The cervix is smooth. Uterus is felt to be normal size, shape, and contour.  No adnexal masses or tenderness noted.Bladder is non tender, no masses felt. Rectal: Good sphincter tone, no polyps, or hemorrhoids felt.  Hemoccult negative. Extremities/musculoskeletal:  No swelling or varicosities noted, no clubbing or cyanosis Psych:  No mood changes, alert and cooperative,seems happy PHQ 2 score 0.  Impression: 1. Well woman exam with routine gynecological exam   2. Screening for colorectal  cancer       Plan: Physical in 1 year Pap in 2020 Mammogram yearly Colonoscopy per GI  Labs next year

## 2017-06-21 ENCOUNTER — Other Ambulatory Visit: Payer: BLUE CROSS/BLUE SHIELD | Admitting: Adult Health

## 2017-07-31 ENCOUNTER — Ambulatory Visit: Payer: BLUE CROSS/BLUE SHIELD

## 2017-08-08 ENCOUNTER — Ambulatory Visit (INDEPENDENT_AMBULATORY_CARE_PROVIDER_SITE_OTHER): Payer: BLUE CROSS/BLUE SHIELD

## 2017-08-08 DIAGNOSIS — Z23 Encounter for immunization: Secondary | ICD-10-CM | POA: Diagnosis not present

## 2017-09-26 DIAGNOSIS — L718 Other rosacea: Secondary | ICD-10-CM | POA: Diagnosis not present

## 2017-09-26 DIAGNOSIS — L821 Other seborrheic keratosis: Secondary | ICD-10-CM | POA: Diagnosis not present

## 2017-09-26 DIAGNOSIS — I781 Nevus, non-neoplastic: Secondary | ICD-10-CM | POA: Diagnosis not present

## 2017-09-26 DIAGNOSIS — L602 Onychogryphosis: Secondary | ICD-10-CM | POA: Diagnosis not present

## 2017-10-01 ENCOUNTER — Ambulatory Visit: Payer: BLUE CROSS/BLUE SHIELD | Admitting: Family Medicine

## 2017-10-01 ENCOUNTER — Encounter: Payer: Self-pay | Admitting: Family Medicine

## 2017-10-01 VITALS — BP 120/92 | Temp 97.9°F | Ht 66.0 in | Wt 200.0 lb

## 2017-10-01 DIAGNOSIS — M7582 Other shoulder lesions, left shoulder: Secondary | ICD-10-CM | POA: Diagnosis not present

## 2017-10-01 DIAGNOSIS — M25512 Pain in left shoulder: Secondary | ICD-10-CM | POA: Diagnosis not present

## 2017-10-01 DIAGNOSIS — J019 Acute sinusitis, unspecified: Secondary | ICD-10-CM | POA: Diagnosis not present

## 2017-10-01 MED ORDER — AMOXICILLIN 500 MG PO TABS: 500.0000 mg | ORAL_TABLET | Freq: Three times a day (TID) | ORAL | 0 refills | Status: DC

## 2017-10-01 MED ORDER — DICLOFENAC SODIUM 75 MG PO TBEC: 75.0000 mg | DELAYED_RELEASE_TABLET | Freq: Two times a day (BID) | ORAL | 0 refills | Status: DC

## 2017-10-01 NOTE — Progress Notes (Signed)
   Subjective:    Patient ID: Rita ChancyBarbara A Bilyk, female    DOB: 1965-10-31, 51 y.o.   MRN: [id number]  HPI\Patient is here today with complaints of a productive cough that she has had for around six days she has used robitussin and it has helped some. She has a sore throat and no energy, She also is having some left shoulder pain, she does not remember hurting it.She is taking Tylenol and some topical treatments for this. Significant head congestion drainage coughing symptoms over the past 4-5 days also intermittent left shoulder pain and discomfort over the past week and a half no known injury hurts to raise it up above her head hurts to put it behind her back denies numbness or tingling   Review of Systems  Constitutional: Negative for activity change and fever.  HENT: Positive for congestion and rhinorrhea. Negative for ear pain.   Eyes: Negative for discharge.  Respiratory: Positive for cough. Negative for shortness of breath and wheezing.   Cardiovascular: Negative for chest pain.  Musculoskeletal: Positive for arthralgias.       Left shoulder pain       Objective:   Physical Exam  Constitutional: She appears well-developed.  HENT:  Head: Normocephalic.  Right Ear: External ear normal.  Left Ear: External ear normal.  Nose: Nose normal.  Mouth/Throat: Oropharynx is clear and moist. No oropharyngeal exudate.  Eyes: Right eye exhibits no discharge. Left eye exhibits no discharge.  Neck: Neck supple. No tracheal deviation present.  Cardiovascular: Normal rate and normal heart sounds.  No murmur heard. Pulmonary/Chest: Effort normal and breath sounds normal. She has no wheezes. She has no rales.  Lymphadenopathy:    She has no cervical adenopathy.  Skin: Skin is warm and dry.  Nursing note and vitals reviewed.   Slight decreased range of motion with the shoulder      Assessment & Plan:  Rotator cuff tendinitis-range of motion exercises anti-inflammatories if not better in  the next 3 weeks notify us we will set up with orthopedics for injection and physical therapy I do not feel x-rays or MRI indicated  Viral syndrome secondary rhinosinusitis antibiotics prescribed warning signs discussed follow-up if problems

## 2018-06-10 ENCOUNTER — Ambulatory Visit: Payer: BLUE CROSS/BLUE SHIELD | Admitting: Family Medicine

## 2018-06-12 ENCOUNTER — Ambulatory Visit: Payer: BLUE CROSS/BLUE SHIELD | Admitting: Family Medicine

## 2018-06-20 ENCOUNTER — Encounter: Payer: Self-pay | Admitting: Family Medicine

## 2018-06-24 ENCOUNTER — Ambulatory Visit (INDEPENDENT_AMBULATORY_CARE_PROVIDER_SITE_OTHER): Payer: BLUE CROSS/BLUE SHIELD | Admitting: Adult Health

## 2018-06-24 ENCOUNTER — Other Ambulatory Visit: Payer: Self-pay

## 2018-06-24 ENCOUNTER — Encounter: Payer: Self-pay | Admitting: Adult Health

## 2018-06-24 VITALS — BP 133/68 | HR 78 | Resp 20 | Ht 66.5 in | Wt 207.0 lb

## 2018-06-24 DIAGNOSIS — Z1212 Encounter for screening for malignant neoplasm of rectum: Secondary | ICD-10-CM

## 2018-06-24 DIAGNOSIS — Z01419 Encounter for gynecological examination (general) (routine) without abnormal findings: Secondary | ICD-10-CM

## 2018-06-24 DIAGNOSIS — Z1211 Encounter for screening for malignant neoplasm of colon: Secondary | ICD-10-CM

## 2018-06-24 DIAGNOSIS — N951 Menopausal and female climacteric states: Secondary | ICD-10-CM | POA: Insufficient documentation

## 2018-06-24 LAB — HEMOCCULT GUIAC POC 1CARD (OFFICE): FECAL OCCULT BLD: NEGATIVE

## 2018-06-24 NOTE — Patient Instructions (Signed)
Menopause Menopause is the normal time of life when menstrual periods stop completely. Menopause is complete when you have missed 12 consecutive menstrual periods. It usually occurs between the ages of 48 years and 55 years. Very rarely does a woman develop menopause before the age of 40 years. At menopause, your ovaries stop producing the female hormones estrogen and progesterone. This can cause undesirable symptoms and also affect your health. Sometimes the symptoms may occur 4-5 years before the menopause begins. There is no relationship between menopause and:  Oral contraceptives.  Number of children you had.  Race.  The age your menstrual periods started (menarche).  Heavy smokers and very thin women may develop menopause earlier in life. What are the causes?  The ovaries stop producing the female hormones estrogen and progesterone. Other causes include:  Surgery to remove both ovaries.  The ovaries stop functioning for no known reason.  Tumors of the pituitary gland in the brain.  Medical disease that affects the ovaries and hormone production.  Radiation treatment to the abdomen or pelvis.  Chemotherapy that affects the ovaries.  What are the signs or symptoms?  Hot flashes.  Night sweats.  Decrease in sex drive.  Vaginal dryness and thinning of the vagina causing painful intercourse.  Dryness of the skin and developing wrinkles.  Headaches.  Tiredness.  Irritability.  Memory problems.  Weight gain.  Bladder infections.  Hair growth of the face and chest.  Infertility. More serious symptoms include:  Loss of bone (osteoporosis) causing breaks (fractures).  Depression.  Hardening and narrowing of the arteries (atherosclerosis) causing heart attacks and strokes.  How is this diagnosed?  When the menstrual periods have stopped for 12 straight months.  Physical exam.  Hormone studies of the blood. How is this treated? There are many treatment  choices and nearly as many questions about them. The decisions to treat or not to treat menopausal changes is an individual choice made with your health care provider. Your health care provider can discuss the treatments with you. Together, you can decide which treatment will work best for you. Your treatment choices may include:  Hormone therapy (estrogen and progesterone).  Non-hormonal medicines.  Treating the individual symptoms with medicine (for example antidepressants for depression).  Herbal medicines that may help specific symptoms.  Counseling by a psychiatrist or psychologist.  Group therapy.  Lifestyle changes including: ? Eating healthy. ? Regular exercise. ? Limiting caffeine and alcohol. ? Stress management and meditation.  No treatment.  Follow these instructions at home:  Take the medicine your health care provider gives you as directed.  Get plenty of sleep and rest.  Exercise regularly.  Eat a diet that contains calcium (good for the bones) and soy products (acts like estrogen hormone).  Avoid alcoholic beverages.  Do not smoke.  If you have hot flashes, dress in layers.  Take supplements, calcium, and vitamin D to strengthen bones.  You can use over-the-counter lubricants or moisturizers for vaginal dryness.  Group therapy is sometimes very helpful.  Acupuncture may be helpful in some cases. Contact a health care provider if:  You are not sure you are in menopause.  You are having menopausal symptoms and need advice and treatment.  You are still having menstrual periods after age 55 years.  You have pain with intercourse.  Menopause is complete (no menstrual period for 12 months) and you develop vaginal bleeding.  You need a referral to a specialist (gynecologist, psychiatrist, or psychologist) for treatment. Get help right   away if:  You have severe depression.  You have excessive vaginal bleeding.  You fell and think you have a  broken bone.  You have pain when you urinate.  You develop leg or chest pain.  You have a fast pounding heart beat (palpitations).  You have severe headaches.  You develop vision problems.  You feel a lump in your breast.  You have abdominal pain or severe indigestion. This information is not intended to replace advice given to you by your health care provider. Make sure you discuss any questions you have with your health care provider. Document Released: 12/29/2003 Document Revised: 03/15/2016 Document Reviewed: 05/07/2013 Elsevier Interactive Patient Education  2017 [employer] Inc.  

## 2018-06-24 NOTE — Progress Notes (Signed)
Patient ID: Rita Smith, female   DOB: 06/26/1966, 52 y.o.   MRN: [id number] History of Present Illness: Rita Smith is a 52 year old white female, married, G2P2, in for well woman gyn exam, she had a normal pap with negative HPV 06/19/16. PCP is scott Luking.    Current Medications, Allergies, Past Medical History, Past Surgical History, Family History and Social History were reviewed in [facility] record.     Review of Systems:  Patient denies any daily headaches(but has migraines, seem to be weather related), hearing loss, fatigue, blurred vision, shortness of breath, chest pain, abdominal pain, problems with bowel movements, urination, or intercourse. No joint pain or mood swings. Periods regular, but brown and lighter Has had few night sweats   Physical Exam:BP 133/68 (BP Location: Right Arm, Patient Position: Sitting, Cuff Size: Normal)   Pulse 78   Resp 20   Ht 5' 6.5" (1.689 m)   Wt 207 lb (93.9 kg)   LMP 06/19/2018   BMI 32.91 kg/m  General:  Well developed, well nourished, no acute distress Skin:  Warm and dry Neck:  Midline trachea, normal thyroid, good ROM, no lymphadenopathy Lungs; Clear to auscultation bilaterally Breast:  No dominant palpable mass, retraction, or nipple discharge Cardiovascular: Regular rate and rhythm Abdomen:  Soft, non tender, no hepatosplenomegaly Pelvic:  External genitalia is normal in appearance, no lesions.  The vagina is normal in appearance. Urethra has no lesions or masses. The cervix is bulbous.  Uterus is felt to be normal size, shape, and contour.  No adnexal masses or tenderness noted.Bladder is non tender, no masses felt. Rectal: Good sphincter tone, no polyps, or hemorrhoids felt.  Hemoccult negative. Extremities/musculoskeletal:  No swelling or varicosities noted, no clubbing or cyanosis Psych:  No mood changes, alert and cooperative,seems happy PHQ 2 score 0. Examination chaperoned by Francene Finders  RN.  Impression: 1. Well woman exam with routine gynecological exam   2. Screening for colorectal cancer   3. Menopausal symptoms       Plan: Pap and physical in 1 year Mammogram yearly Colonoscopy advised,call Dr Linward Natal with PCP

## 2018-07-08 ENCOUNTER — Ambulatory Visit: Payer: BLUE CROSS/BLUE SHIELD | Admitting: Family Medicine

## 2018-07-08 ENCOUNTER — Encounter: Payer: Self-pay | Admitting: Family Medicine

## 2018-07-08 VITALS — BP 132/92 | Temp 98.3°F | Ht 66.0 in | Wt 205.0 lb

## 2018-07-08 DIAGNOSIS — J069 Acute upper respiratory infection, unspecified: Secondary | ICD-10-CM

## 2018-07-08 NOTE — Patient Instructions (Signed)
   Rita MccreedyBarbara if you are getting worse or not turning the corner over the next 5 or 6 days please call      Upper Respiratory Infection, Adult Most upper respiratory infections (URIs) are caused by a virus. A URI affects the nose, throat, and upper air passages. The most common type of URI is often called "the common cold." Follow these instructions at home:  Take medicines only as told by your doctor.  Gargle warm saltwater or take cough drops to comfort your throat as told by your doctor.  Use a warm mist humidifier or inhale steam from a shower to increase air moisture. This may make it easier to breathe.  Drink enough fluid to keep your pee (urine) clear or pale yellow.  Eat soups and other clear broths.  Have a healthy diet.  Rest as needed.  Go back to work when your fever is gone or your doctor says it is okay. ? You may need to stay home longer to avoid giving your URI to others. ? You can also wear a face mask and wash your hands often to prevent spread of the virus.  Use your inhaler more if you have asthma.  Do not use any tobacco products, including cigarettes, chewing tobacco, or electronic cigarettes. If you need help quitting, ask your doctor. Contact a doctor if:  You are getting worse, not better.  Your symptoms are not helped by medicine.  You have chills.  You are getting more short of breath.  You have brown or red mucus.  You have yellow or brown discharge from your nose.  You have pain in your face, especially when you bend forward.  You have a fever.  You have puffy (swollen) neck glands.  You have pain while swallowing.  You have white areas in the back of your throat. Get help right away if:  You have very bad or constant: ? Headache. ? Ear pain. ? Pain in your forehead, behind your eyes, and over your cheekbones (sinus pain). ? Chest pain.  You have long-lasting (chronic) lung disease and any of the  following: ? Wheezing. ? Long-lasting cough. ? Coughing up blood. ? A change in your usual mucus.  You have a stiff neck.  You have changes in your: ? Vision. ? Hearing. ? Thinking. ? Mood. This information is not intended to replace advice given to you by your health care provider. Make sure you discuss any questions you have with your health care provider. Document Released: 03/26/2008 Document Revised: 06/10/2016 Document Reviewed: 01/13/2014 Elsevier Interactive Patient Education  2018 [employer]

## 2018-07-08 NOTE — Progress Notes (Signed)
   Subjective:    Patient ID: Rita Smith, female    DOB: 1966-07-28, 52 y.o.   MRN: [id number]  HPI  Patient is here today with complaints of a cough, runny nose,fever, left ear pain,diarrhea that started last night.She has been taking cough drops and alavert. Significant head congestion drainage coughing sinus pressure not feeling good denies high fever chills sweats Review of Systems  Constitutional: Negative for activity change and fever.  HENT: Positive for congestion and rhinorrhea. Negative for ear pain.   Eyes: Negative for discharge.  Respiratory: Positive for cough. Negative for shortness of breath and wheezing.   Cardiovascular: Negative for chest pain.       Objective:   Physical Exam  Constitutional: She appears well-developed.  HENT:  Head: Normocephalic.  Nose: Nose normal.  Mouth/Throat: Oropharynx is clear and moist. No oropharyngeal exudate.  Neck: Neck supple.  Cardiovascular: Normal rate and normal heart sounds.  No murmur heard. Pulmonary/Chest: Effort normal and breath sounds normal. She has no wheezes.  Lymphadenopathy:    She has no cervical adenopathy.  Skin: Skin is warm and dry.  Nursing note and vitals reviewed.    15 minutes was spent with patient today discussing healthcare issues which they came.  More than 50% of this visit-total duration of visit-was spent in counseling and coordination of care.  Please see diagnosis regarding the focus of this coordination and care      Assessment & Plan:  Viral syndrome Supportive measures No need for antibiotics Warning signs discussed Follow-up if ongoing troubles

## 2018-07-15 ENCOUNTER — Ambulatory Visit: Payer: BLUE CROSS/BLUE SHIELD | Admitting: Family Medicine

## 2018-07-31 ENCOUNTER — Ambulatory Visit: Payer: BLUE CROSS/BLUE SHIELD | Admitting: Family Medicine

## 2018-07-31 ENCOUNTER — Encounter: Payer: Self-pay | Admitting: Family Medicine

## 2018-07-31 VITALS — BP 118/76 | Ht 66.0 in | Wt 209.0 lb

## 2018-07-31 DIAGNOSIS — Z135 Encounter for screening for eye and ear disorders: Secondary | ICD-10-CM | POA: Diagnosis not present

## 2018-07-31 DIAGNOSIS — Z23 Encounter for immunization: Secondary | ICD-10-CM

## 2018-07-31 DIAGNOSIS — G8929 Other chronic pain: Secondary | ICD-10-CM

## 2018-07-31 DIAGNOSIS — M25561 Pain in right knee: Secondary | ICD-10-CM

## 2018-07-31 NOTE — Progress Notes (Signed)
   Subjective:    Patient ID: Rita Smith, female    DOB: [birth year], 52 y.o.   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   Flu vaccine today.   Right knee pain: several years, denies injury. Pain is worse after sitting for long periods and going to stand up. Does okay with stairs and squatting. Denies any problems with lifting. Able to do all the activities she wants to do. Right knee pain to the knee cap a few days a week - takes tylenol or advil otc which is helpful.   Review of Systems  Constitutional: Negative for fever and unexpected weight change.  Respiratory: Negative for shortness of breath.   Cardiovascular: Negative for chest pain.  Musculoskeletal: Negative for back pain.       Objective:   Physical Exam  Constitutional: She is oriented to person, place, and time. She appears well-developed and well-nourished. No distress.  HENT:  Head: Normocephalic and atraumatic.  Cardiovascular: Normal rate, regular rhythm and normal heart sounds.  No murmur heard. Pulmonary/Chest: Effort normal and breath sounds normal. No respiratory distress.  Musculoskeletal: Normal range of motion. She exhibits no edema, tenderness or deformity.  Right knee with crepitus on extension.   Neurological: She is alert and oriented to person, place, and time.  Skin: Skin is warm and dry.  Psychiatric: She has a normal mood and affect.  Nursing note and vitals reviewed.     Assessment & Plan:  Wellness form for work filled out but will wait to sign until completed at next nurse visit- no immunization records available for the patient.  She will bring in her immunization record at her nurse visit next week when she gets her TB test.  Flu shot given today.  Will likely need a Tdap at next visit. Pt had wellness exam with GYN in September of this year, recommended her preventive screenings - mammogram and colonoscopy that are due.  Patient with  chronic right knee pain.  She would like to try glucosamine/chondroitin supplement and her current efforts at weight loss.  May continue to take over-the-counter Tylenol or ibuprofen as needed for the pain.  She will follow-up if pain worsens or fails to improve.

## 2018-08-05 ENCOUNTER — Ambulatory Visit: Payer: BLUE CROSS/BLUE SHIELD

## 2018-08-06 ENCOUNTER — Ambulatory Visit (INDEPENDENT_AMBULATORY_CARE_PROVIDER_SITE_OTHER): Payer: BLUE CROSS/BLUE SHIELD | Admitting: *Deleted

## 2018-08-06 ENCOUNTER — Ambulatory Visit: Payer: BLUE CROSS/BLUE SHIELD

## 2018-08-06 ENCOUNTER — Encounter: Payer: Self-pay | Admitting: Family Medicine

## 2018-08-06 DIAGNOSIS — Z111 Encounter for screening for respiratory tuberculosis: Secondary | ICD-10-CM | POA: Diagnosis not present

## 2018-08-08 LAB — TB SKIN TEST
INDURATION: 0 mm
TB SKIN TEST: NEGATIVE

## 2018-08-19 ENCOUNTER — Encounter: Payer: Self-pay | Admitting: Family Medicine

## 2018-08-19 ENCOUNTER — Ambulatory Visit: Payer: BLUE CROSS/BLUE SHIELD | Admitting: Family Medicine

## 2018-08-19 VITALS — BP 122/78 | Temp 98.2°F | Ht 66.0 in | Wt 209.4 lb

## 2018-08-19 DIAGNOSIS — J209 Acute bronchitis, unspecified: Secondary | ICD-10-CM

## 2018-08-19 DIAGNOSIS — B9689 Other specified bacterial agents as the cause of diseases classified elsewhere: Secondary | ICD-10-CM

## 2018-08-19 DIAGNOSIS — J019 Acute sinusitis, unspecified: Secondary | ICD-10-CM

## 2018-08-19 MED ORDER — AMOXICILLIN 500 MG PO TABS: 500.0000 mg | ORAL_TABLET | Freq: Three times a day (TID) | ORAL | 0 refills | Status: DC

## 2018-08-19 MED ORDER — ALBUTEROL SULFATE HFA 108 (90 BASE) MCG/ACT IN AERS: 2.0000 | INHALATION_SPRAY | Freq: Four times a day (QID) | RESPIRATORY_TRACT | 2 refills | Status: DC | PRN

## 2018-08-19 NOTE — Progress Notes (Signed)
   Subjective:    Patient ID: Rita Smith, female    DOB: [birth year], 52 y.o.   MRN: [id number]  Cough  This is a new problem. The current episode started in the past 7 days. The cough is productive of sputum. Associated symptoms include headaches, rhinorrhea, a sore throat and wheezing. Pertinent negatives include no chest pain, ear pain, fever or shortness of breath. Treatments tried: alka-seltzer, delsym. The treatment provided mild relief.    Patient has had multiple days of congestion drainage coughing sinus pressure in addition to this some occasional wheezing but not bad.  Denies shortness of breath.  Does not smoke.  Frequent upper respiratory illnesses and reactive airway  Review of Systems  Constitutional: Negative for activity change and fever.  HENT: Positive for congestion, rhinorrhea and sore throat. Negative for ear pain.   Eyes: Negative for discharge.  Respiratory: Positive for cough and wheezing. Negative for shortness of breath.   Cardiovascular: Negative for chest pain.  Neurological: Positive for headaches.       Objective:   Physical Exam  Constitutional: She appears well-developed.  HENT:  Head: Normocephalic.  Nose: Nose normal.  Mouth/Throat: Oropharynx is clear and moist. No oropharyngeal exudate.  Neck: Neck supple.  Cardiovascular: Normal rate and normal heart sounds.  No murmur heard. Pulmonary/Chest: Effort normal and breath sounds normal. She has no wheezes.  Lymphadenopathy:    She has no cervical adenopathy.  Skin: Skin is warm and dry.  Nursing note and vitals reviewed.         Assessment & Plan:  Mild bronchitis along with mild sinusitis antibiotic prescribed warning signs discussed albuterol albuterol for reactive airway with if ongoing troubles high fevers or worse follow-up

## 2018-08-20 ENCOUNTER — Ambulatory Visit: Payer: BLUE CROSS/BLUE SHIELD | Admitting: Family Medicine

## 2018-08-29 ENCOUNTER — Telehealth: Payer: Self-pay | Admitting: Family Medicine

## 2018-08-29 MED ORDER — HYDROCODONE-HOMATROPINE 5-1.5 MG/5ML PO SYRP: 5.0000 mL | ORAL_SOLUTION | Freq: Every evening | ORAL | 0 refills | Status: DC | PRN

## 2018-08-29 NOTE — Telephone Encounter (Signed)
Pt was seen last week by Dr. Lorin Picket. She has bronchitis and would like to know if a cough medicine could be called into [id number] - [place], [place] - [address]

## 2018-08-29 NOTE — Telephone Encounter (Signed)
Script printed and signed. Pt contacted and informed that she would need to come by and pick it up. Pt verbalized understanding.

## 2018-08-29 NOTE — Telephone Encounter (Signed)
Hycodan 3 oz one tspn qhs prn cough 

## 2018-08-29 NOTE — Telephone Encounter (Signed)
Patient is not running a temp.Please advise.

## 2019-06-11 ENCOUNTER — Telehealth: Payer: Self-pay | Admitting: Advanced Practice Midwife

## 2019-06-11 NOTE — Telephone Encounter (Signed)
Pt states that she is starting to go through menopause, but her menstrual cycles have been very heavy and she would like to see if there is something that can be done to help her during this time.

## 2019-06-11 NOTE — Telephone Encounter (Signed)
LVM for pt to call back to schedule.

## 2019-06-22 ENCOUNTER — Telehealth: Payer: Self-pay | Admitting: Obstetrics and Gynecology

## 2019-06-22 NOTE — Telephone Encounter (Signed)
Called patient and left message informing her that we are not allowing any visitors or children to come to visit with her at this time and we are requiring a mask to be worn during the visit. Asked if she has had any exposure to anyone suspected or confirmed of having COVID-19 or if she is experiencing any of the following: fever, cough, sob, muscle pain, severe headache, sore throat, diarrhea, loss of taste or smell or ear, nose or throat discomfort to call and reschedule.   °

## 2019-06-23 ENCOUNTER — Other Ambulatory Visit: Payer: Self-pay

## 2019-06-23 ENCOUNTER — Ambulatory Visit: Payer: BC Managed Care – PPO | Admitting: Obstetrics and Gynecology

## 2019-06-23 ENCOUNTER — Encounter: Payer: Self-pay | Admitting: Obstetrics and Gynecology

## 2019-06-23 VITALS — BP 127/73 | HR 79 | Ht 67.0 in | Wt 214.4 lb

## 2019-06-23 DIAGNOSIS — N951 Menopausal and female climacteric states: Secondary | ICD-10-CM

## 2019-06-23 NOTE — Progress Notes (Signed)
,jfgynp   [facility] Visit  @DATE @            Patient name: Rita Smith MRN [id number]  Date of birth: 28-Oct-1965  CC & HPI:  Rita Smith is a 53 y.o. female presenting today for discussion about hormones and menopause.  She is 52, still having fairly regular periods she has been regular for 3 months and rather light through today.  She skipped July and August was heavy.  Periods were heavy during 2019.  She has intermittent headaches that she does not think of is menstrual headaches she does use the word migraines  ROS:  ROS   Pertinent History Reviewed:   Reviewed: Significant for family history notable for mother with a menopause at 65 without any vasomotor symptoms Medical         Past Medical History:  Diagnosis Date  . Colitis   . Headache(784.0)   . Rosacea   . Urinary frequency 06/15/2015  . Uveitis   . Varicose vein of leg 05/17/2014  . Vitamin D deficiency                               Surgical Hx:    Past Surgical History:  Procedure Laterality Date  . CESAREAN SECTION    . COLONOSCOPY     Medications: Reviewed & Updated - see associated section                       Current Outpatient Medications:  .  Homeopathic Products (ALLERGY MEDICINE PO), Take by mouth as needed., Disp: , Rfl:  .  SUMAtriptan (IMITREX) 25 MG tablet, Take 25 mg by mouth every 2 (two) hours as needed for migraine. May repeat in 2 hours if headache persists or recurs., Disp: , Rfl:  .  albuterol (PROVENTIL HFA;VENTOLIN HFA) 108 (90 Base) MCG/ACT inhaler, Inhale 2 puffs into the lungs every 6 (six) hours as needed for wheezing. (Patient not taking: Reported on 06/23/2019), Disp: 1 Inhaler, Rfl: 2 .  amoxicillin (AMOXIL) 500 MG tablet, Take 1 tablet (500 mg total) by mouth 3 (three) times daily. (Patient not taking: Reported on 06/23/2019), Disp: 30 tablet, Rfl: 0 .  aspirin 81 MG tablet, Take 81 mg by mouth daily., Disp: , Rfl:  .  HYDROcodone-homatropine (HYCODAN) 5-1.5 MG/5ML  syrup, Take 5 mLs by mouth at bedtime as needed for cough. (Patient not taking: Reported on 06/23/2019), Disp: 90 mL, Rfl: 0   Social History: Reviewed -  reports that she has never smoked. She has never used smokeless tobacco.  Objective Findings:  Vitals: Blood pressure 127/73, pulse 79, height 5\' 7"  (1.702 m), weight 214 lb 6.4 oz (97.3 kg), last menstrual period 06/14/2019.  PHYSICAL EXAMINATION General appearance - alert, well appearing, and in no distress and overweight Mental status - alert, oriented to person, place, and time Chest -  Heart -  Abdomen -  Breasts -  Skin -   P  Assessment & Plan:   A:  1.  perimenopause.  When she is experiencing sounds fairly normal.  She is not interested in hormone manipulations at this time as she wishes to avoid exacerbating headaches and is afraid it might have that effect  P:  1. Check FSH TSH 2. Offered to check metabolic panel is been 5 years since it has been collected.  Patient will call back tomorrow if she desires that  she does not desire that at this time.

## 2019-07-02 ENCOUNTER — Other Ambulatory Visit (INDEPENDENT_AMBULATORY_CARE_PROVIDER_SITE_OTHER): Payer: BC Managed Care – PPO

## 2019-07-02 ENCOUNTER — Other Ambulatory Visit: Payer: Self-pay

## 2019-07-02 DIAGNOSIS — Z23 Encounter for immunization: Secondary | ICD-10-CM | POA: Diagnosis not present

## 2019-07-23 ENCOUNTER — Other Ambulatory Visit: Payer: BLUE CROSS/BLUE SHIELD | Admitting: Advanced Practice Midwife

## 2019-09-23 ENCOUNTER — Telehealth: Payer: Self-pay | Admitting: Obstetrics and Gynecology

## 2019-09-23 NOTE — Telephone Encounter (Signed)

## 2019-09-24 ENCOUNTER — Ambulatory Visit (INDEPENDENT_AMBULATORY_CARE_PROVIDER_SITE_OTHER): Payer: BC Managed Care – PPO | Admitting: Obstetrics and Gynecology

## 2019-09-24 ENCOUNTER — Encounter: Payer: Self-pay | Admitting: Obstetrics and Gynecology

## 2019-09-24 ENCOUNTER — Other Ambulatory Visit (HOSPITAL_COMMUNITY)
Admission: RE | Admit: 2019-09-24 | Discharge: 2019-09-24 | Disposition: A | Discharge status: Home / Self Care | Payer: BC Managed Care – PPO | Source: Ambulatory Visit | Attending: Obstetrics and Gynecology | Admitting: Obstetrics and Gynecology

## 2019-09-24 ENCOUNTER — Other Ambulatory Visit: Payer: Self-pay

## 2019-09-24 VITALS — BP 109/69 | HR 93 | Ht 67.0 in | Wt 213.0 lb

## 2019-09-24 DIAGNOSIS — Z01419 Encounter for gynecological examination (general) (routine) without abnormal findings: Secondary | ICD-10-CM

## 2019-09-24 NOTE — Progress Notes (Addendum)
Patient ID: Rita Smith, female   DOB: [birth year], 53 y.o.   MRN: [id number]  Assessment:  Annual Gyn Exam Blood work Plan:  1. pap smear done, next pap due 5 years 2. CBC, CMET, TSH, hemoglobin A1C 3    Annual mammogram advised after age 76  colonoscopy advised or Cologuard Subjective:  Rita Smith is a 53 y.o. female G2P2 who presents for annual exam. Patient's last menstrual period was 08/08/2019 (exact date). The patient has complaints today of none. She needs to schedule a mammogram but plans to do so before the year ends. When she was 38 she had colitis and had a colonoscopy at that time. She is having hot flashes. She would like her TSH checked.    The following portions of the patient's history were reviewed and updated as appropriate: allergies, current medications, past family history, past medical history, past social history, past surgical history and problem list. Past Medical History:  Diagnosis Date  . Colitis   . Headache(784.0)   . Rosacea   . Urinary frequency 06/15/2015  . Uveitis   . Varicose vein of leg 05/17/2014  . Vitamin D deficiency     Past Surgical History:  Procedure Laterality Date  . CESAREAN SECTION    . COLONOSCOPY       Current Outpatient Medications:  .  albuterol (PROVENTIL HFA;VENTOLIN HFA) 108 (90 Base) MCG/ACT inhaler, Inhale 2 puffs into the lungs every 6 (six) hours as needed for wheezing., Disp: 1 Inhaler, Rfl: 2 .  aspirin 81 MG tablet, Take 81 mg by mouth daily., Disp: , Rfl:  .  Homeopathic Products (ALLERGY MEDICINE PO), Take by mouth as needed., Disp: , Rfl:  .  HYDROcodone-homatropine (HYCODAN) 5-1.5 MG/5ML syrup, Take 5 mLs by mouth at bedtime as needed for cough., Disp: 90 mL, Rfl: 0 .  SUMAtriptan (IMITREX) 25 MG tablet, Take 25 mg by mouth every 2 (two) hours as needed for migraine. May repeat in 2 hours if headache persists or recurs., Disp: , Rfl:   Review of Systems Constitutional: negative Gastrointestinal:  negative Genitourinary: normal  Objective:  BP 109/69 (BP Location: Left Arm, Patient Position: Sitting, Cuff Size: Normal)   Pulse 93   Ht 5\' 7"  (1.702 m)   Wt 213 lb (96.6 kg)   LMP 08/08/2019 (Exact Date)   BMI 33.36 kg/m    BMI: Body mass index is 33.36 kg/m.  General Appearance: Alert, appropriate appearance for age. No acute distress HEENT: Grossly normal Neck / Thyroid:  Cardiovascular: RRR; normal S1, S2, no murmur Lungs: CTA bilaterally Back: No CVAT Breast Exam: No masses or nodes.No dimpling, nipple retraction or discharge. Gastrointestinal: Soft, non-tender, no masses or organomegaly Pelvic Exam: VAGINA: normal appearing vagina CERVIX: normal appearing cervix  UTERUS: uterus is normal RECTAL: guaiac negative stool obtained,  PAP: Pap smear done today. Lymphatic Exam: Non-palpable nodes in neck, clavicular, axillary, or inguinal regions Skin: no rash or abnormalities Neurologic: Normal gait and speech, no tremor  Psychiatric: Alert and oriented, appropriate affect.  Urinalysis:Not done  By signing my name below, I, Samul Dada, attest that this documentation has been prepared under the direction and in the presence of Jonnie Kind, MD. Electronically Signed: [place]. 09/24/19. 3:49 PM.  I personally performed the services described in this documentation, which was SCRIBED in my presence. The recorded information has been reviewed and considered accurate. It has been edited as necessary during review. Jonnie Kind, MD

## 2019-09-28 ENCOUNTER — Other Ambulatory Visit (HOSPITAL_COMMUNITY): Payer: Self-pay | Admitting: Family Medicine

## 2019-09-28 ENCOUNTER — Telehealth: Payer: Self-pay | Admitting: *Deleted

## 2019-09-28 DIAGNOSIS — Z1231 Encounter for screening mammogram for malignant neoplasm of breast: Secondary | ICD-10-CM

## 2019-09-28 LAB — CYTOLOGY - PAP
Comment: NEGATIVE
Diagnosis: NEGATIVE
High risk HPV: NEGATIVE

## 2019-09-28 NOTE — Telephone Encounter (Signed)
LMOVM per Dr Glo Herring, pap with negative HPV testing and can repeat HPV in 5 yrs. Advised she can do physical yearly if desired.

## 2019-10-12 ENCOUNTER — Ambulatory Visit (HOSPITAL_COMMUNITY)
Admission: RE | Admit: 2019-10-12 | Discharge: 2019-10-12 | Disposition: A | Discharge status: Home / Self Care | Payer: BC Managed Care – PPO | Source: Ambulatory Visit | Attending: Family Medicine | Admitting: Family Medicine

## 2019-10-12 ENCOUNTER — Other Ambulatory Visit: Payer: Self-pay

## 2019-10-12 DIAGNOSIS — Z1231 Encounter for screening mammogram for malignant neoplasm of breast: Secondary | ICD-10-CM | POA: Diagnosis present

## 2019-10-27 LAB — COMPREHENSIVE METABOLIC PANEL
ALT: 34 IU/L — ABNORMAL HIGH (ref 0–32)
AST: 30 IU/L (ref 0–40)
Albumin/Globulin Ratio: 1.7 (ref 1.2–2.2)
Albumin: 3.9 g/dL (ref 3.8–4.9)
Alkaline Phosphatase: 66 IU/L (ref 39–117)
BUN/Creatinine Ratio: 10 (ref 9–23)
BUN: 10 mg/dL (ref 6–24)
Bilirubin Total: 0.5 mg/dL (ref 0.0–1.2)
CO2: 22 mmol/L (ref 20–29)
Calcium: 9.6 mg/dL (ref 8.7–10.2)
Chloride: 102 mmol/L (ref 96–106)
Creatinine, Ser: 0.96 mg/dL (ref 0.57–1.00)
GFR calc Af Amer: 77 mL/min/{1.73_m2} (ref >59)
GFR calc non Af Amer: 67 mL/min/{1.73_m2} (ref >59)
Globulin, Total: 2.3 g/dL (ref 1.5–4.5)
Glucose: 93 mg/dL (ref 65–99)
Potassium: 4 mmol/L (ref 3.5–5.2)
Sodium: 137 mmol/L (ref 134–144)
Total Protein: 6.2 g/dL (ref 6.0–8.5)

## 2019-10-27 LAB — LIPID PANEL WITH LDL/HDL RATIO
Cholesterol, Total: 212 mg/dL — ABNORMAL HIGH (ref 100–199)
HDL: 69 mg/dL (ref >39)
LDL Chol Calc (NIH): 121 mg/dL — ABNORMAL HIGH (ref 0–99)
LDL/HDL Ratio: 1.8 ratio (ref 0.0–3.2)
Triglycerides: 128 mg/dL (ref 0–149)
VLDL Cholesterol Cal: 22 mg/dL (ref 5–40)

## 2019-10-27 LAB — CBC
Hematocrit: 48.1 % — ABNORMAL HIGH (ref 34.0–46.6)
Hemoglobin: 16 g/dL — ABNORMAL HIGH (ref 11.1–15.9)
MCH: 30 pg (ref 26.6–33.0)
MCHC: 33.3 g/dL (ref 31.5–35.7)
MCV: 90 fL (ref 79–97)
Platelets: 276 10*3/uL (ref 150–450)
RBC: 5.34 x10E6/uL — ABNORMAL HIGH (ref 3.77–5.28)
RDW: 12.7 % (ref 11.7–15.4)
WBC: 6.7 10*3/uL (ref 3.4–10.8)

## 2019-10-27 LAB — HEMOGLOBIN A1C
Est. average glucose Bld gHb Est-mCnc: 108 mg/dL
Hgb A1c MFr Bld: 5.4 % (ref 4.8–5.6)

## 2019-10-27 LAB — THYROID PANEL WITH TSH
Free Thyroxine Index: 1.4 (ref 1.2–4.9)
T3 Uptake Ratio: 23 % — ABNORMAL LOW (ref 24–39)
T4, Total: 6.2 ug/dL (ref 4.5–12.0)
TSH: 4.11 u[IU]/mL (ref 0.450–4.500)

## 2019-10-27 NOTE — Progress Notes (Signed)
Hemoglobin excellent, thyroid function normal, diabetes screen normal Hemoglobin A1C, normal kidney function.  Cholesterol is a bit high, so diet, exercise and weight control all good ideas, Of note, the GOOD cholesterol HDL is quite high, which is excellent and minimizes the effect of the slightly high LDL(bad) cholesterol.

## 2019-11-03 ENCOUNTER — Ambulatory Visit (INDEPENDENT_AMBULATORY_CARE_PROVIDER_SITE_OTHER): Payer: BC Managed Care – PPO | Admitting: Family Medicine

## 2019-11-03 DIAGNOSIS — B349 Viral infection, unspecified: Secondary | ICD-10-CM | POA: Diagnosis not present

## 2019-11-03 NOTE — Progress Notes (Signed)
   Subjective:    Patient ID: Rita Smith, female    DOB: [birth year], 54 y.o.   MRN: [id number]  Fever  This is a new problem. The current episode started in the past 7 days (started Sunday pm). Associated symptoms include headaches. Associated symptoms comments: fatigue.      Review of Systems  Constitutional: Positive for fever.  Neurological: Positive for headaches.   Virtual Visit via Video Note  I connected with Rita Smith on 11/03/19 at  2:00 PM EST by a video enabled telemedicine application and verified that I am speaking with the correct person using two identifiers.  Location: Patient: home Provider: office   I discussed the limitations of evaluation and management by telemedicine and the availability of in person appointments. The patient expressed understanding and agreed to proceed.  History of Present Illness:    Observations/Objective:   Assessment and Plan:   Follow Up Instructions:    I discussed the assessment and treatment plan with the patient. The patient was provided an opportunity to ask questions and all were answered. The patient agreed with the plan and demonstrated an understanding of the instructions.   The patient was advised to call back or seek an in-person evaluation if the symptoms worsen or if the condition fails to improve as anticipated.  I provided of non-face-to-face time during this encounter.  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  Discussed.  Potential for COVID-19 discussed.  Symptom care discussed.  Warning signs discussed.  Testing recommended patient states  will do

## 2019-11-04 ENCOUNTER — Other Ambulatory Visit: Payer: Self-pay

## 2019-11-04 ENCOUNTER — Ambulatory Visit: Payer: BC Managed Care – PPO | Attending: Internal Medicine

## 2019-11-04 DIAGNOSIS — Z20822 Contact with and (suspected) exposure to covid-19: Secondary | ICD-10-CM

## 2019-11-05 LAB — NOVEL CORONAVIRUS, NAA: SARS-CoV-2, NAA: NOT DETECTED

## 2019-11-07 ENCOUNTER — Encounter: Payer: Self-pay | Admitting: Family Medicine

## 2019-12-20 ENCOUNTER — Ambulatory Visit: Payer: BC Managed Care – PPO | Attending: Internal Medicine

## 2019-12-20 DIAGNOSIS — Z23 Encounter for immunization: Secondary | ICD-10-CM | POA: Insufficient documentation

## 2019-12-20 NOTE — Progress Notes (Signed)
   Covid-19 Vaccination Clinic  Name:  Rita Smith    MRN: [id number] DOB: 1966-02-20  12/20/2019  Rita Smith was observed post Covid-19 immunization for 15 minutes without incidence. She was provided with Vaccine Information Sheet and instruction to access the V-Safe system.   Rita Smith was instructed to call 911 with any severe reactions post vaccine: Marland Kitchen Difficulty breathing  . Swelling of your face and throat  . A fast heartbeat  . A bad rash all over your body  . Dizziness and weakness    Immunizations Administered    Name Date Dose VIS Date Route   Moderna COVID-19 Vaccine 12/20/2019  4:19 PM 0.5 mL 09/22/2019 Intramuscular   Manufacturer: Moderna   Lot: [id number]   NDC: 44925-241-59

## 2020-01-23 ENCOUNTER — Ambulatory Visit: Payer: BC Managed Care – PPO | Attending: Internal Medicine

## 2020-01-23 DIAGNOSIS — Z23 Encounter for immunization: Secondary | ICD-10-CM

## 2020-01-23 NOTE — Progress Notes (Signed)
   Covid-19 Vaccination Clinic  Name:  JOLANTA CABEZA    MRN: [id number] DOB: 03/19/1966  01/23/2020  Ms. Rockhold was observed post Covid-19 immunization for 15 minutes without incident. She was provided with Vaccine Information Sheet and instruction to access the V-Safe system.   Ms. Nobrega was instructed to call 911 with any severe reactions post vaccine: Marland Kitchen Difficulty breathing  . Swelling of face and throat  . A fast heartbeat  . A bad rash all over body  . Dizziness and weakness   Immunizations Administered    Name Date Dose VIS Date Route   Moderna COVID-19 Vaccine 01/23/2020 11:18 AM 0.5 mL 09/22/2019 Intramuscular   Manufacturer: Moderna   Lot: [id number]   NDC: 47340-370-96

## 2020-02-29 ENCOUNTER — Ambulatory Visit (HOSPITAL_COMMUNITY)
Admission: RE | Admit: 2020-02-29 | Discharge: 2020-02-29 | Disposition: A | Discharge status: Home / Self Care | Payer: BC Managed Care – PPO | Source: Ambulatory Visit | Attending: Family Medicine | Admitting: Family Medicine

## 2020-02-29 ENCOUNTER — Other Ambulatory Visit: Payer: Self-pay

## 2020-02-29 ENCOUNTER — Encounter: Payer: Self-pay | Admitting: Family Medicine

## 2020-02-29 ENCOUNTER — Telehealth: Payer: Self-pay | Admitting: *Deleted

## 2020-02-29 ENCOUNTER — Telehealth (INDEPENDENT_AMBULATORY_CARE_PROVIDER_SITE_OTHER): Payer: BC Managed Care – PPO | Admitting: Family Medicine

## 2020-02-29 DIAGNOSIS — J302 Other seasonal allergic rhinitis: Secondary | ICD-10-CM | POA: Diagnosis not present

## 2020-02-29 DIAGNOSIS — M79671 Pain in right foot: Secondary | ICD-10-CM

## 2020-02-29 MED ORDER — DICLOFENAC SODIUM 75 MG PO TBEC: 75.0000 mg | DELAYED_RELEASE_TABLET | Freq: Two times a day (BID) | ORAL | 0 refills | Status: DC

## 2020-02-29 NOTE — Progress Notes (Signed)
Virtual Visit via Video Note  I connected with Rita Smith on 02/29/20 at  2:30 PM EDT by a video enabled telemedicine application and verified that I am speaking with the correct person using two identifiers.  Location: Patient: home Provider: office   I discussed the limitations of evaluation and management by telemedicine and the availability of in person appointments. The patient expressed understanding and agreed to proceed.      Patient ID: Rita Smith, female    DOB: [birth year], 54 y.o.   MRN: [id number]   Chief Complaint  Patient presents with  . Foot Pain   Subjective:    HPI Pt seen for video visit. Pt having Right heel pain for 2 wks. Feels swollen and tender to the touch. Can barely put weight on it. Started a couple weeks ago. Pain is on bottom and left side of foot.  tried soaking epsom salt.   Has been able to walk on it.  Very tender in heel and near ankle bone. Tender in medial and inside of medial malleolus. Feels swollen and puffy. Left foot feels normal. No swelling on left.  Slight tenderness on bottom of the foot. 4 yrs ago went to foot doctor and thought had a bone spur.  But top of foot not hurting. Adjusted her shoes and wearing socks more. No supports in shoes.  epson salt soaks prn and helping some.  No new shoes.  Has been doing aerobic videos but having to take it easy with flexing the foot on the right.  Work as Sports administrator and flats. High arches can't wear flip flops. No falls or sprain to ankle. Walking some, but not excessive. Taking advil and ibuprofen. Helped with rolling ice bottle under the foot.  Has some voice coming and going and allergy related sinus changes. Taking zyrtec and alevert. Up to date on covid vaccine. Last 2 days started with allergy symptoms.  Feels this is her "usual seasonal allergies."  Medical History Scarlet has a past medical history of Colitis, Headache(784.0), Rosacea, Urinary  frequency (06/15/2015), Uveitis, Varicose vein of leg (05/17/2014), and Vitamin D deficiency.   Outpatient Encounter Medications as of 02/29/2020  Medication Sig  . albuterol (PROVENTIL HFA;VENTOLIN HFA) 108 (90 Base) MCG/ACT inhaler Inhale 2 puffs into the lungs every 6 (six) hours as needed for wheezing.  Marland Kitchen aspirin 81 MG tablet Take 81 mg by mouth daily.  . Homeopathic Products (ALLERGY MEDICINE PO) Take by mouth as needed.  . SUMAtriptan (IMITREX) 25 MG tablet Take 25 mg by mouth every 2 (two) hours as needed for migraine. May repeat in 2 hours if headache persists or recurs.  . diclofenac (VOLTAREN) 75 MG EC tablet Take 1 tablet (75 mg total) by mouth 2 (two) times daily. Prn pain.  . [DISCONTINUED] HYDROcodone-homatropine (HYCODAN) 5-1.5 MG/5ML syrup Take 5 mLs by mouth at bedtime as needed for cough.   No facility-administered encounter medications on file as of 02/29/2020.     Review of Systems  Constitutional: Negative for chills and fever.  HENT: Positive for voice change. Negative for congestion, ear discharge, ear pain, postnasal drip, rhinorrhea, sinus pressure, sinus pain, sneezing and sore throat.   Eyes: Negative for pain, discharge and itching.  Respiratory: Negative for cough and wheezing.   Cardiovascular: Negative for chest pain.  Musculoskeletal: Negative for back pain, gait problem and joint swelling.       +rt heel pain.   Skin: Negative for rash.  Vitals There were no vitals taken for this visit.  Objective:   Physical Exam  MSK- ttp over the rt medial heel pad, normal rom of foot/ankle. Mild swelling, no erythema.  Left foot/medial ankle normal in appearance.  Skin- warm ,dry, - no rash.   Assessment and Plan   1. Pain of right heel - DG Foot Complete Right; Future - diclofenac (VOLTAREN) 75 MG EC tablet; Take 1 tablet (75 mg total) by mouth 2 (two) times daily. Prn pain.  Dispense: 60 tablet; Refill: 0  2. Seasonal allergies   Advising to take  diclofenac 75mg  bid prn pain. Ice roller for foot pain 2x per day. Xray ordered for rt heel pain.  Seasonal allergies- cont otc meds and flonase.   Follow Up Instructions:    I discussed the assessment and treatment plan with the patient. The patient was provided an opportunity to ask questions and all were answered. The patient agreed with the plan and demonstrated an understanding of the instructions.   The patient was advised to call back or seek an in-person evaluation if the symptoms worsen or if the condition fails to improve as anticipated.  I provided 20 minutes of non-face-to-face time during this encounter.

## 2020-02-29 NOTE — Telephone Encounter (Signed)
Rita Smith, Rita Smith are scheduled for a virtual visit with your provider today.    Just as we do with appointments in the office, we must obtain your consent to participate.  Your consent will be active for this visit and any virtual visit you may have with one of our providers in the next 365 days.    If you have a MyChart account, I can also send a copy of this consent to you electronically.  All virtual visits are billed to your insurance company just like a traditional visit in the office.  As this is a virtual visit, video technology does not allow for your provider to perform a traditional examination.  This may limit your provider's ability to fully assess your condition.  If your provider identifies any concerns that need to be evaluated in person or the need to arrange testing such as labs, EKG, etc, we will make arrangements to do so.    Although advances in technology are sophisticated, we cannot ensure that it will always work on either your end or our end.  If the connection with a video visit is poor, we may have to switch to a telephone visit.  With either a video or telephone visit, we are not always able to ensure that we have a secure connection.   I need to obtain your verbal consent now.   Are you willing to proceed with your visit today?   Rita Smith has provided verbal consent on 02/29/2020 for a virtual visit (video or telephone).   Kyra Manges, LPN 8/[id number]/5449  2:01 PM

## 2020-05-13 ENCOUNTER — Telehealth: Payer: Self-pay | Admitting: Adult Health

## 2020-05-13 NOTE — Telephone Encounter (Signed)
Patient is having menopause symptoms, headaches, loss of energy, hot flashes. Would like someone to follow up with her regarding symptoms.

## 2020-05-20 ENCOUNTER — Encounter: Payer: Self-pay | Admitting: Adult Health

## 2020-05-20 ENCOUNTER — Ambulatory Visit: Payer: BC Managed Care – PPO | Admitting: Adult Health

## 2020-05-20 VITALS — BP 123/69 | HR 90 | Ht 66.5 in | Wt 216.0 lb

## 2020-05-20 DIAGNOSIS — R61 Generalized hyperhidrosis: Secondary | ICD-10-CM

## 2020-05-20 DIAGNOSIS — R5383 Other fatigue: Secondary | ICD-10-CM | POA: Diagnosis not present

## 2020-05-20 DIAGNOSIS — R52 Pain, unspecified: Secondary | ICD-10-CM | POA: Insufficient documentation

## 2020-05-20 DIAGNOSIS — R4589 Other symptoms and signs involving emotional state: Secondary | ICD-10-CM

## 2020-05-20 DIAGNOSIS — R232 Flushing: Secondary | ICD-10-CM

## 2020-05-20 DIAGNOSIS — N951 Menopausal and female climacteric states: Secondary | ICD-10-CM | POA: Diagnosis not present

## 2020-05-20 DIAGNOSIS — N926 Irregular menstruation, unspecified: Secondary | ICD-10-CM

## 2020-05-20 MED ORDER — ESTRADIOL-LEVONORGESTREL 0.045-0.015 MG/DAY TD PTWK: 1.0000 | MEDICATED_PATCH | TRANSDERMAL | 12 refills | Status: DC

## 2020-05-20 NOTE — Patient Instructions (Signed)
Menopause and Hormone Replacement Therapy Menopause is a normal time of life when menstrual periods stop completely and the ovaries stop producing the female hormones estrogen and progesterone. This lack of hormones can affect your health and cause undesirable symptoms. Hormone replacement therapy (HRT) can relieve some of those symptoms. What is hormone replacement therapy? HRT is the use of artificial (synthetic) hormones to replace hormones that your body has stopped producing because you have reached menopause. What are my options for HRT?  HRT may consist of the synthetic hormones estrogen and progestin, or it may consist of only estrogen (estrogen-only therapy). You and your health care provider will decide which form of HRT is best for you. If you choose to be on HRT and you have a uterus, estrogen and progestin are usually prescribed. Estrogen-only therapy is used for women who do not have a uterus. Possible options for taking HRT include:  Pills.  Patches.  Gels.  Sprays.  Vaginal cream.  Vaginal rings.  Vaginal inserts. The amount of hormone(s) that you take and how long you take the hormone(s) varies according to your health. It is important to:  Begin HRT with the lowest possible dosage.  Stop HRT as soon as your health care provider tells you to stop.  Work with your health care provider so that you feel informed and comfortable with your decisions. What are the benefits of HRT? HRT can reduce the frequency and severity of menopausal symptoms. Benefits of HRT vary according to the kind of symptoms that you have, how severe they are, and your overall health. HRT may help to improve the following symptoms of menopause:  Hot flashes and night sweats. These are sudden feelings of heat that spread over the face and body. The skin may turn red, like a blush. Night sweats are hot flashes that happen while you are sleeping or trying to sleep.  Bone loss (osteoporosis). The  body loses calcium more quickly after menopause, causing the bones to become weaker. This can increase the risk for bone breaks (fractures).  Vaginal dryness. The lining of the vagina can become thin and dry, which can cause pain during sex or cause infection, burning, or itching.  Urinary tract infections.  Urinary incontinence. This is the inability to control when you pass urine.  Irritability.  Short-term memory problems. What are the risks of HRT? Risks of HRT vary depending on your individual health and medical history. Risks of HRT also depend on whether you receive both estrogen and progestin or you receive estrogen only. HRT may increase the risk of:  Spotting. This is when a small amount of blood leaks from the vagina unexpectedly.  Endometrial cancer. This cancer is in the lining of the uterus (endometrium).  Breast cancer.  Increased density of breast tissue. This can make it harder to find breast cancer on a breast X-ray (mammogram).  Stroke.  Heart disease.  Blood clots.  Gallbladder disease.  Liver disease. Risks of HRT can increase if you have any of the following conditions:  Endometrial cancer.  Liver disease.  Heart disease.  Breast cancer.  History of blood clots.  History of stroke. Follow these instructions at home:  Take over-the-counter and prescription medicines only as told by your health care provider.  Get mammograms, pelvic exams, and medical checkups as often as told by your health care provider.  Have Pap tests done as often as told by your health care provider. A Pap test is sometimes called a Pap smear. It   is a screening test that is used to check for signs of cancer of the cervix and vagina. A Pap test can also identify the presence of infection or precancerous changes. Pap tests may be done: ? Every 3 years, starting at age 21. ? Every 5 years, starting after age 30, in combination with testing for human papillomavirus  (HPV). ? More often or less often depending on other medical conditions you have, your age, and other risk factors.  It is up to you to get the results of your Pap test. Ask your health care provider, or the department that is doing the test, when your results will be ready.  Keep all follow-up visits as told by your health care provider. This is important. Contact a health care provider if you have:  Pain or swelling in your legs.  Shortness of breath.  Chest pain.  Lumps or changes in your breasts or armpits.  Slurred speech.  Pain, burning, or bleeding when you urinate.  Unusual vaginal bleeding.  Dizziness or headaches.  Weakness or numbness in any part of your arms or legs.  Pain in your abdomen. Summary  Menopause is a normal time of life when menstrual periods stop completely and the ovaries stop producing the female hormones estrogen and progesterone.  Hormone replacement therapy (HRT) can relieve some of the symptoms of menopause.  HRT can reduce the frequency and severity of menopausal symptoms.  Risks of HRT vary depending on your individual health and medical history. This information is not intended to replace advice given to you by your health care provider. Make sure you discuss any questions you have with your health care provider. Document Revised: 06/10/2018 Document Reviewed: 06/10/2018 Elsevier Patient Education  2020 Elsevier Inc. Menopause Menopause is the normal time of life when menstrual periods stop completely. It is usually confirmed by 12 months without a menstrual period. The transition to menopause (perimenopause) most often happens between the ages of 45 and 55. During perimenopause, hormone levels change in your body, which can cause symptoms and affect your health. Menopause may increase your risk for:  Loss of bone (osteoporosis), which causes bone breaks (fractures).  Depression.  Hardening and narrowing of the arteries  (atherosclerosis), which can cause heart attacks and strokes. What are the causes? This condition is usually caused by a natural change in hormone levels that happens as you get older. The condition may also be caused by surgery to remove both ovaries (bilateral oophorectomy). What increases the risk? This condition is more likely to start at an earlier age if you have certain medical conditions or treatments, including:  A tumor of the pituitary gland in the brain.  A disease that affects the ovaries and hormone production.  Radiation treatment for cancer.  Certain cancer treatments, such as chemotherapy or hormone (anti-estrogen) therapy.  Heavy smoking and excessive alcohol use.  Family history of early menopause. This condition is also more likely to develop earlier in women who are very thin. What are the signs or symptoms? Symptoms of this condition include:  Hot flashes.  Irregular menstrual periods.  Night sweats.  Changes in feelings about sex. This could be a decrease in sex drive or an increased comfort around your sexuality.  Vaginal dryness and thinning of the vaginal walls. This may cause painful intercourse.  Dryness of the skin and development of wrinkles.  Headaches.  Problems sleeping (insomnia).  Mood swings or irritability.  Memory problems.  Weight gain.  Hair growth on the face   and chest.  Bladder infections or problems with urinating. How is this diagnosed? This condition is diagnosed based on your medical history, a physical exam, your age, your menstrual history, and your symptoms. Hormone tests may also be done. How is this treated? In some cases, no treatment is needed. You and your health care provider should make a decision together about whether treatment is necessary. Treatment will be based on your individual condition and preferences. Treatment for this condition focuses on managing symptoms. Treatment may include:  Menopausal  hormone therapy (MHT).  Medicines to treat specific symptoms or complications.  Acupuncture.  Vitamin or herbal supplements. Before starting treatment, make sure to let your health care provider know if you have a personal or family history of:  Heart disease.  Breast cancer.  Blood clots.  Diabetes.  Osteoporosis. Follow these instructions at home: Lifestyle  Do not use any products that contain nicotine or tobacco, such as cigarettes and e-cigarettes. If you need help quitting, ask your health care provider.  Get at least 30 minutes of physical activity on 5 or more days each week.  Avoid alcoholic and caffeinated beverages, as well as spicy foods. This may help prevent hot flashes.  Get 7-8 hours of sleep each night.  If you have hot flashes, try: ? Dressing in layers. ? Avoiding things that may trigger hot flashes, such as spicy food, warm places, or stress. ? Taking slow, deep breaths when a hot flash starts. ? Keeping a fan in your home and office.  Find ways to manage stress, such as deep breathing, meditation, or journaling.  Consider going to group therapy with other women who are having menopause symptoms. Ask your health care provider about recommended group therapy meetings. Eating and drinking  Eat a healthy, balanced diet that contains whole grains, lean protein, low-fat dairy, and plenty of fruits and vegetables.  Your health care provider may recommend adding more soy to your diet. Foods that contain soy include tofu, tempeh, and soy milk.  Eat plenty of foods that contain calcium and vitamin D for bone health. Items that are rich in calcium include low-fat milk, yogurt, beans, almonds, sardines, broccoli, and kale. Medicines  Take over-the-counter and prescription medicines only as told by your health care provider.  Talk with your health care provider before starting any herbal supplements. If prescribed, take vitamins and supplements as told by your  health care provider. These may include: ? Calcium. Women age 51 and older should get 1,200 mg (milligrams) of calcium every day. ? Vitamin D. Women need 600-800 International Units of vitamin D each day. ? Vitamins B12 and B6. Aim for 50 micrograms of B12 and 1.5 mg of B6 each day. General instructions  Keep track of your menstrual periods, including: ? When they occur. ? How heavy they are and how long they last. ? How much time passes between periods.  Keep track of your symptoms, noting when they start, how often you have them, and how long they last.  Use vaginal lubricants or moisturizers to help with vaginal dryness and improve comfort during sex.  Keep all follow-up visits as told by your health care provider. This is important. This includes any group therapy or counseling. Contact a health care provider if:  You are still having menstrual periods after age 55.  You have pain during sex.  You have not had a period for 12 months and you develop vaginal bleeding. Get help right away if:  You have: ?   Severe depression. ? Excessive vaginal bleeding. ? Pain when you urinate. ? A fast or irregular heart beat (palpitations). ? Severe headaches. ? Abdomen (abdominal) pain or severe indigestion.  You fell and you think you have a broken bone.  You develop leg or chest pain.  You develop vision problems.  You feel a lump in your breast. Summary  Menopause is the normal time of life when menstrual periods stop completely. It is usually confirmed by 12 months without a menstrual period.  The transition to menopause (perimenopause) most often happens between the ages of 45 and 55.  Symptoms can be managed through medicines, lifestyle changes, and complementary therapies such as acupuncture.  Eat a balanced diet that is rich in nutrients to promote bone health and heart health and to manage symptoms during menopause. This information is not intended to replace advice given  to you by your health care provider. Make sure you discuss any questions you have with your health care provider. Document Revised: 09/20/2017 Document Reviewed: 11/10/2016 Elsevier Patient Education  2020 Elsevier Inc.  

## 2020-05-20 NOTE — Progress Notes (Signed)
  Subjective:     Patient ID: Rita Smith, female   DOB: 26-May-1966, 54 y.o.   MRN: [id number]  HPI Rita Smith is a 54 year old white female,married, G2P2 in to discuss her menopause symptoms, see ROS.  She got braces this week.  She has tried black cohosh, with out relief.  Review of Systems +irregular periods,LMP December 2020, some spotting  ++hot flashes +night sweats Has fatigue feels lethargic, decreased energy +moody  +body aches Does not sleep all night, will wake up to pee and if gets hot   Reviewed past medical,surgical, social and family history. Reviewed medications and allergies.     Objective:   Physical Exam BP 123/69 (BP Location: Left Arm, Patient Position: Sitting, Cuff Size: Large)   Pulse 90   Ht 5' 6.5" (1.689 m)   Wt (!) 216 lb (98 kg)   LMP 10/12/2019 (Approximate)   BMI 34.34 kg/m  Skin warm and dry. Neck: mid line trachea, normal thyroid, good ROM, no lymphadenopathy noted. Lungs: clear to ausculation bilaterally. Cardiovascular: regular rate and rhythm. Fall risk is low     Assessment:     1. Fatigue, unspecified type Check CBC,CMP,TSH,free T4 and FSH  2. Hot flashes  3. Night sweats  4. Moody   5. Irregular periods   6. Body aches   7. Menopausal symptoms Discussed menopause with her, and her symptoms, and trying HRT and reviewed risks and benefits and using for short period, since has occasional migraine with aura will go with patch, after discussing with Dr Despina Hidden  Meds ordered this encounter  Medications  . estradiol-levonorgestrel (CLIMARAPRO) 0.045-0.015 MG/DAY    Sig: Place 1 patch onto the skin once a week.    Dispense:  4 patch    Refill:  12    Order Specific Question:   Supervising Provider    Answer:   Duane Lope H [[id number]]     Do not take black cohosh.  Plan:     Review handouts on Menopause and menopause and HRT. Follow up in 10 weeks or sooner if needed

## 2020-05-21 LAB — CBC
Hematocrit: 47.8 % — ABNORMAL HIGH (ref 34.0–46.6)
Hemoglobin: 15.8 g/dL (ref 11.1–15.9)
MCH: 29.6 pg (ref 26.6–33.0)
MCHC: 33.1 g/dL (ref 31.5–35.7)
MCV: 90 fL (ref 79–97)
Platelets: 300 10*3/uL (ref 150–450)
RBC: 5.34 x10E6/uL — ABNORMAL HIGH (ref 3.77–5.28)
RDW: 12 % (ref 11.7–15.4)
WBC: 6.2 10*3/uL (ref 3.4–10.8)

## 2020-05-21 LAB — COMPREHENSIVE METABOLIC PANEL
ALT: 43 IU/L — ABNORMAL HIGH (ref 0–32)
AST: 27 IU/L (ref 0–40)
Albumin/Globulin Ratio: 1.7 (ref 1.2–2.2)
Albumin: 4.4 g/dL (ref 3.8–4.9)
Alkaline Phosphatase: 73 IU/L (ref 48–121)
BUN/Creatinine Ratio: 15 (ref 9–23)
BUN: 13 mg/dL (ref 6–24)
Bilirubin Total: 0.6 mg/dL (ref 0.0–1.2)
CO2: 26 mmol/L (ref 20–29)
Calcium: 9.8 mg/dL (ref 8.7–10.2)
Chloride: 102 mmol/L (ref 96–106)
Creatinine, Ser: 0.88 mg/dL (ref 0.57–1.00)
GFR calc Af Amer: 86 mL/min/{1.73_m2} (ref >59)
GFR calc non Af Amer: 74 mL/min/{1.73_m2} (ref >59)
Globulin, Total: 2.6 g/dL (ref 1.5–4.5)
Glucose: 78 mg/dL (ref 65–99)
Potassium: 4.4 mmol/L (ref 3.5–5.2)
Sodium: 141 mmol/L (ref 134–144)
Total Protein: 7 g/dL (ref 6.0–8.5)

## 2020-05-21 LAB — TSH: TSH: 2.22 u[IU]/mL (ref 0.450–4.500)

## 2020-05-21 LAB — T4, FREE: Free T4: 1.07 ng/dL (ref 0.82–1.77)

## 2020-07-12 ENCOUNTER — Other Ambulatory Visit: Payer: Self-pay | Admitting: Critical Care Medicine

## 2020-07-12 ENCOUNTER — Telehealth: Payer: BC Managed Care – PPO | Admitting: Physician Assistant

## 2020-07-12 ENCOUNTER — Other Ambulatory Visit: Payer: BC Managed Care – PPO

## 2020-07-12 DIAGNOSIS — Z20822 Contact with and (suspected) exposure to covid-19: Secondary | ICD-10-CM

## 2020-07-12 DIAGNOSIS — R197 Diarrhea, unspecified: Secondary | ICD-10-CM

## 2020-07-12 NOTE — Progress Notes (Signed)
We are sorry that you are not feeling well.  Here is how we plan to help!  Based on what you have shared with me it looks like you have Acute Infectious Diarrhea.  It is STILL POSSIBLE YOU HAVE COVID 19, even though you have had a negative tet last Thursday. You need to take another test, preferably a PCR test which is more accurate.   Most cases of acute diarrhea are due to infections with virus and bacteria and are self-limited conditions lasting less than 14 days.  For your symptoms you may take Imodium 2 mg tablets that are over the counter at your local pharmacy. Take two tablet now and then one after each loose stool up to 6 a day.  Antibiotics are not needed for most people with diarrhea.   HOME CARE  We recommend changing your diet to help with your symptoms for the next few days.  Drink plenty of fluids that contain water salt and sugar. Sports drinks such as Gatorade may help.   You may try broths, soups, bananas, applesauce, soft breads, mashed potatoes or crackers.   You are considered infectious for as long as the diarrhea continues. Hand washing or use of alcohol based hand sanitizers is recommend.  It is best to stay out of work or school until your symptoms stop.   GET HELP RIGHT AWAY  If you have dark yellow colored urine or do not pass urine frequently you should drink more fluids.    If your symptoms worsen   If you feel like you are going to pass out (faint)  You have a new problem  MAKE SURE YOU   Understand these instructions.  Will watch your condition.  Will get help right away if you are not doing well or get worse.  Your e-visit answers were reviewed by a board certified advanced clinical practitioner to complete your personal care plan.  Depending on the condition, your plan could have included both over the counter or prescription medications.  If there is a problem please reply  once you have received a response from your provider.  Your  safety is important to [place].  If you have drug allergies check your prescription carefully.    You can use MyChart to ask questions about today's visit, request a non-urgent call back, or ask for a work or school excuse for 24 hours related to this e-Visit. If it has been greater than 24 hours you will need to follow up with your provider, or enter a new e-Visit to address those concerns.   You will get an e-mail in the next two days asking about your experience.  I hope that your e-visit has been valuable and will speed your recovery. Thank you for using e-visits.   5  Minutes spent on charting

## 2020-07-14 LAB — SPECIMEN STATUS REPORT

## 2020-07-14 LAB — NOVEL CORONAVIRUS, NAA: SARS-CoV-2, NAA: NOT DETECTED

## 2020-07-14 LAB — SARS-COV-2, NAA 2 DAY TAT

## 2020-07-29 ENCOUNTER — Ambulatory Visit: Payer: BC Managed Care – PPO | Admitting: Adult Health

## 2020-08-15 ENCOUNTER — Ambulatory Visit: Payer: BC Managed Care – PPO | Admitting: Adult Health

## 2020-08-16 ENCOUNTER — Other Ambulatory Visit: Payer: Self-pay

## 2020-08-16 ENCOUNTER — Other Ambulatory Visit: Payer: BC Managed Care – PPO

## 2020-08-16 DIAGNOSIS — Z20822 Contact with and (suspected) exposure to covid-19: Secondary | ICD-10-CM

## 2020-08-17 LAB — NOVEL CORONAVIRUS, NAA: SARS-CoV-2, NAA: NOT DETECTED

## 2020-08-17 LAB — SARS-COV-2, NAA 2 DAY TAT

## 2020-08-29 ENCOUNTER — Other Ambulatory Visit (INDEPENDENT_AMBULATORY_CARE_PROVIDER_SITE_OTHER): Payer: BC Managed Care – PPO

## 2020-08-29 ENCOUNTER — Other Ambulatory Visit: Payer: Self-pay

## 2020-08-29 DIAGNOSIS — Z23 Encounter for immunization: Secondary | ICD-10-CM

## 2020-08-30 ENCOUNTER — Other Ambulatory Visit: Payer: BC Managed Care – PPO

## 2020-09-13 ENCOUNTER — Ambulatory Visit: Payer: BC Managed Care – PPO | Admitting: Adult Health

## 2020-09-19 ENCOUNTER — Encounter: Payer: Self-pay | Admitting: Adult Health

## 2020-09-19 ENCOUNTER — Ambulatory Visit: Payer: BC Managed Care – PPO | Admitting: Adult Health

## 2020-09-19 ENCOUNTER — Other Ambulatory Visit: Payer: Self-pay

## 2020-09-19 VITALS — BP 116/76 | HR 73 | Ht 66.5 in | Wt 210.0 lb

## 2020-09-19 DIAGNOSIS — N926 Irregular menstruation, unspecified: Secondary | ICD-10-CM

## 2020-09-19 DIAGNOSIS — R61 Generalized hyperhidrosis: Secondary | ICD-10-CM | POA: Diagnosis not present

## 2020-09-19 DIAGNOSIS — N951 Menopausal and female climacteric states: Secondary | ICD-10-CM

## 2020-09-19 DIAGNOSIS — R232 Flushing: Secondary | ICD-10-CM | POA: Diagnosis not present

## 2020-09-19 MED ORDER — VENLAFAXINE HCL ER 37.5 MG PO CP24: 37.5000 mg | ORAL_CAPSULE | Freq: Every day | ORAL | 3 refills | Status: DC

## 2020-09-19 NOTE — Progress Notes (Signed)
  Subjective:     Patient ID: [name], female   DOB: Feb 27, 1966, 54 y.o.   MRN: [id number]  HPI Oma is a 54 year old white female, married, G2P2 back in follow up on starting climara pro for menopausal symptoms but she stopped about 6 weeks ago, did not see big difference. She said her grandma did not stop periods til 55-56. PCP is [facility].   Review of Systems +hot flashes  +night sweats  +moody  LMP 12 2020, spotted July non since Does not sleep well  Some headaches  Has decreased libido  Reviewed past medical,surgical, social and family history. Reviewed medications and allergies.      Objective:   Physical Exam BP 116/76 (BP Location: Right Arm, Patient Position: Sitting, Cuff Size: Normal)   Pulse 73   Ht 5' 6.5" (1.689 m)   Wt 210 lb (95.3 kg)   BMI 33.39 kg/m  Skin warm and dry. Lungs: clear to ausculation bilaterally. Cardiovascular: regular rate and rhythm.   Fall risk is low  Upstream - 09/19/20 1418      Pregnancy Intention Screening   Does the patient want to become pregnant in the next year? No    Does the patient's partner want to become pregnant in the next year? No    Would the patient like to discuss contraceptive options today? No      Contraception Wrap Up   Current Method Female Condom    End Method Female Condom    Contraception Counseling Provided No          Assessment:     1. Menopausal symptoms Check FSH Will Rx Effexor Meds ordered this encounter  Medications  . venlafaxine XR (EFFEXOR XR) 37.5 MG 24 hr capsule    Sig: Take 1 capsule (37.5 mg total) by mouth daily with breakfast.    Dispense:  30 capsule    Refill:  3    Order Specific Question:   Supervising Provider    Answer:   Despina Hidden, LUTHER H [[id number]]    2. Hot flashes   3. Night sweats   4. Irregular periods Will check FSH, may need [place] to assess uterus     Plan:     Follow up in 10 weeks

## 2020-09-20 LAB — FOLLICLE STIMULATING HORMONE: FSH: 47.3 m[IU]/mL

## 2020-09-22 ENCOUNTER — Telehealth: Payer: Self-pay | Admitting: Adult Health

## 2020-09-22 ENCOUNTER — Telehealth: Payer: Self-pay | Admitting: *Deleted

## 2020-09-22 DIAGNOSIS — N950 Postmenopausal bleeding: Secondary | ICD-10-CM

## 2020-09-22 NOTE — Telephone Encounter (Signed)
-----   Message from Adline Potter, NP sent at 09/22/2020  3:25 PM EST ----- Let Rita Smith know [place] 10/26/20 at [facility] at 3:30 arrive at 3:15 with full bladder.Marland Kitchen thx

## 2020-09-22 NOTE — Telephone Encounter (Signed)
Pt aware [place] is @ [facility] 10/26/20 @ 3:30 arrive @ 3:15 with a full bladder. JSY

## 2020-09-22 NOTE — Telephone Encounter (Signed)
Pt aware of FSH and will get pelvic US to assess uterus since having PMB, will [place] at [facility]

## 2020-09-29 ENCOUNTER — Ambulatory Visit (INDEPENDENT_AMBULATORY_CARE_PROVIDER_SITE_OTHER): Payer: BC Managed Care – PPO | Admitting: Family Medicine

## 2020-09-29 ENCOUNTER — Encounter: Payer: Self-pay | Admitting: Family Medicine

## 2020-09-29 ENCOUNTER — Other Ambulatory Visit: Payer: Self-pay

## 2020-09-29 VITALS — HR 82 | Temp 97.8°F | Resp 16 | Wt 212.4 lb

## 2020-09-29 DIAGNOSIS — J069 Acute upper respiratory infection, unspecified: Secondary | ICD-10-CM

## 2020-09-29 DIAGNOSIS — R062 Wheezing: Secondary | ICD-10-CM | POA: Diagnosis not present

## 2020-09-29 DIAGNOSIS — B9789 Other viral agents as the cause of diseases classified elsewhere: Secondary | ICD-10-CM | POA: Insufficient documentation

## 2020-09-29 MED ORDER — GUAIFENESIN-CODEINE 100-10 MG/5ML PO SOLN: 5.0000 mL | Freq: Four times a day (QID) | ORAL | 0 refills | Status: DC | PRN

## 2020-09-29 MED ORDER — ALBUTEROL SULFATE HFA 108 (90 BASE) MCG/ACT IN AERS: 2.0000 | INHALATION_SPRAY | Freq: Four times a day (QID) | RESPIRATORY_TRACT | 0 refills | Status: DC | PRN

## 2020-09-29 NOTE — Progress Notes (Signed)
Patient ID: Rita Smith, female    DOB: Aug 06, 1966, 54 y.o.   MRN: [id number]   Chief Complaint  Patient presents with  . Cough   Subjective:  CC: cough and weakness  This is a new problem.  Presents today with a complaint of cough, feeling weak, and winded.  Associated symptoms include headache, some shortness of breath, and wheezing, also reports diarrhea x1.  Has tried over-the-counter cough suppressant, and Tylenol.  Is able to stay hydrated.  Denies fever, chills, chest pain, ear pain, abdominal pain.  Cough This is a new problem. Episode onset: Monday  The cough is non-productive. Associated symptoms include headaches, shortness of breath and wheezing. Pertinent negatives include no chills or fever. Associated symptoms comments: Diarrhea.  Appetite has been slightly off but still eating and drinking. . She has tried OTC cough suppressant (motrin) for the symptoms.     Medical History Rita Smith has a past medical history of Colitis, Headache(784.0), Rosacea, Urinary frequency (06/15/2015), Uveitis, Varicose vein of leg (05/17/2014), and Vitamin D deficiency.   Outpatient Encounter Medications as of 09/29/2020  Medication Sig  . albuterol (VENTOLIN HFA) 108 (90 Base) MCG/ACT inhaler Inhale 2 puffs into the lungs every 6 (six) hours as needed for wheezing or shortness of breath.  Marland Kitchen aspirin 81 MG tablet Take 81 mg by mouth daily.  . Aspirin-Acetaminophen-Caffeine (EXCEDRIN MIGRAINE PO) Take by mouth as needed.  Marland Kitchen guaiFENesin-codeine 100-10 MG/5ML syrup Take 5 mLs by mouth every 6 (six) hours as needed for cough. At night time. Do not drive while taking.  . Homeopathic Products (ALLERGY MEDICINE PO) Take by mouth as needed.  Marland Kitchen ibuprofen (ADVIL) 200 MG tablet Take 800 mg by mouth as needed.  . SUMAtriptan (IMITREX) 25 MG tablet Take 25 mg by mouth every 2 (two) hours as needed for migraine. May repeat in 2 hours if headache persists or recurs.  . venlafaxine XR (EFFEXOR XR) 37.5 MG  24 hr capsule Take 1 capsule (37.5 mg total) by mouth daily with breakfast. (Patient not taking: Reported on 09/29/2020)  . [DISCONTINUED] albuterol (PROVENTIL HFA;VENTOLIN HFA) 108 (90 Base) MCG/ACT inhaler Inhale 2 puffs into the lungs every 6 (six) hours as needed for wheezing.   No facility-administered encounter medications on file as of 09/29/2020.     Review of Systems  Constitutional: Positive for fatigue. Negative for chills and fever.  Respiratory: Positive for cough, shortness of breath and wheezing.   Neurological: Positive for headaches.     Vitals Pulse 82   Temp 97.8 F (36.6 C)   Resp 16   Wt 212 lb 6.4 oz (96.3 kg)   SpO2 98%   BMI 33.77 kg/m   Objective:   Physical Exam Vitals reviewed.  Constitutional:      General: She is not in acute distress.    Appearance: She is not toxic-appearing.  HENT:     Right Ear: Tympanic membrane normal.     Left Ear: Tympanic membrane normal.     Mouth/Throat:     Mouth: Mucous membranes are moist.     Pharynx: No posterior oropharyngeal erythema.  Cardiovascular:     Rate and Rhythm: Normal rate and regular rhythm.     Heart sounds: Normal heart sounds.  Pulmonary:     Effort: Pulmonary effort is normal.     Breath sounds: Wheezing present.     Comments: Faint expiratory wheezes. Skin:    General: Skin is warm and dry.  Neurological:  General: No focal deficit present.     Mental Status: She is alert.  Psychiatric:        Behavior: Behavior normal.      Assessment and Plan   1. Wheezing on expiration - albuterol (VENTOLIN HFA) 108 (90 Base) MCG/ACT inhaler; Inhale 2 puffs into the lungs every 6 (six) hours as needed for wheezing or shortness of breath.  Dispense: 8 g; Refill: 0 - guaiFENesin-codeine 100-10 MG/5ML syrup; Take 5 mLs by mouth every 6 (six) hours as needed for cough. At night time. Do not drive while taking.  Dispense: 120 mL; Refill: 0 - Novel Coronavirus, NAA ([facility])  2. Viral upper  respiratory tract infection - albuterol (VENTOLIN HFA) 108 (90 Base) MCG/ACT inhaler; Inhale 2 puffs into the lungs every 6 (six) hours as needed for wheezing or shortness of breath.  Dispense: 8 g; Refill: 0 - guaiFENesin-codeine 100-10 MG/5ML syrup; Take 5 mLs by mouth every 6 (six) hours as needed for cough. At night time. Do not drive while taking.  Dispense: 120 mL; Refill: 0 - Novel Coronavirus, NAA ([facility])   Likely has a viral upper respiratory tract infection, will treat symptoms.  Due to expiratory wheezing, will give her albuterol inhaler, as the one she has is possibly expired.  She wishes for something stronger for cough, instructed to only take it at night, and do not drive while taking the guaifenesin with codeine.  She is instructed to stay hydrated, and supportive therapy is recommended.  Agrees with plan of care discussed today. Understands warning signs to seek further care: Chest pain, shortness of breath, any significant change in health. Understands to follow-up if symptoms do not improve, or worsen.   Dorena Bodo, FNP-C

## 2020-10-01 LAB — NOVEL CORONAVIRUS, NAA

## 2020-10-01 LAB — SPECIMEN STATUS REPORT

## 2020-10-26 ENCOUNTER — Ambulatory Visit (HOSPITAL_COMMUNITY): Payer: BC Managed Care – PPO

## 2020-11-07 ENCOUNTER — Ambulatory Visit (HOSPITAL_COMMUNITY): Payer: BC Managed Care – PPO

## 2020-11-08 ENCOUNTER — Encounter: Payer: Self-pay | Admitting: Family Medicine

## 2020-11-08 ENCOUNTER — Ambulatory Visit (INDEPENDENT_AMBULATORY_CARE_PROVIDER_SITE_OTHER): Payer: BC Managed Care – PPO | Admitting: Family Medicine

## 2020-11-08 ENCOUNTER — Other Ambulatory Visit: Payer: Self-pay

## 2020-11-08 VITALS — Wt 216.0 lb

## 2020-11-08 DIAGNOSIS — R059 Cough, unspecified: Secondary | ICD-10-CM

## 2020-11-08 NOTE — Patient Instructions (Signed)

## 2020-11-08 NOTE — Progress Notes (Signed)
   Subjective:    Patient ID: Rita Smith, female    DOB: Dec 21, 1965, 55 y.o.   MRN: [id number]  Cough This is a new problem. Episode onset: Sunday night. The cough is non-productive. Associated symptoms include chills, a fever, headaches and nasal congestion. Associated symptoms comments: Fatigue, aches . Treatments tried: simbacol, prescription codeine cough syrup, advil and motrin.   Viral-like illness for several days body aches fever headache congestion not feeling good denies vomiting or diarrhea  Review of Systems  Constitutional: Positive for chills and fever.  Respiratory: Positive for cough.   Neurological: Positive for headaches.       Objective:   Physical Exam Eardrums normal mucous membranes moist neck no masses lungs respiratory rate normal heart regular O2 saturation 98%       Assessment & Plan:  Suspected COVID COVID test taken Rest Few days No work Warning signs discussed in detail

## 2020-11-10 LAB — SPECIMEN STATUS REPORT

## 2020-11-10 LAB — SARS-COV-2, NAA 2 DAY TAT

## 2020-11-10 LAB — NOVEL CORONAVIRUS, NAA: SARS-CoV-2, NAA: DETECTED — AB

## 2020-11-15 ENCOUNTER — Ambulatory Visit (HOSPITAL_COMMUNITY): Payer: BC Managed Care – PPO

## 2020-11-16 ENCOUNTER — Telehealth: Payer: Self-pay

## 2020-11-16 NOTE — Telephone Encounter (Signed)
Pt returned called but went to voicemail. Need to call pt back

## 2020-11-16 NOTE — Telephone Encounter (Signed)
Pt has had Covid since this past Thursday and she still not feeling good real weak and unable to eat and she is wanting to know what she can do?   Pt call back [phone] or [phone]

## 2020-11-16 NOTE — Telephone Encounter (Signed)
Discussed with pt. Pt states she would like to wait on cxr til seen and would like appt tomorrow afternoon. Transferred her up to be scheduled.

## 2020-11-16 NOTE — Telephone Encounter (Signed)
Left message to return call 

## 2020-11-16 NOTE — Telephone Encounter (Signed)
Called to get more information from patient. Pt states she is eating some and keeping down liquids. but does not have a lot of energy, sense of taste is muted and smell is gone. Working from home. Does not know if she has sob. Does not have O2 sensor. States her nose is stuffy and that's why she thinks she feels sob.  Felt like she was wheezing and using inhaler a couple times a day. Coughing more yesterday and today. No fever.

## 2020-11-16 NOTE — Telephone Encounter (Signed)
Nurses if she feels she is having a severe time I recommend urgent care or ER current Covid can make a person feel fatigued and sick sometimes for several weeks If breathing is getting compromise that that is a sign that need need to be seen Also it is perfectly fine for [place] to recheck her this week Otherwise please see the following We can schedule her for tomorrow afternoon to be rechecked If she is having discomfort in her chest and a lot of increased coughing I would recommend a chest x-ray today stat-await results If she does not feel she is that bad she can wait till tomorrow for our evaluation

## 2020-11-17 ENCOUNTER — Other Ambulatory Visit: Payer: Self-pay

## 2020-11-17 ENCOUNTER — Ambulatory Visit (INDEPENDENT_AMBULATORY_CARE_PROVIDER_SITE_OTHER): Payer: BC Managed Care – PPO | Admitting: Family Medicine

## 2020-11-17 DIAGNOSIS — U071 COVID-19: Secondary | ICD-10-CM | POA: Diagnosis not present

## 2020-11-17 NOTE — Progress Notes (Signed)
   Subjective:    Patient ID: Rita Smith, female    DOB: 1966-07-19, 55 y.o.   MRN: [id number]  HPI  Patient with lingering cough and congestion since testing positive for Covid 11/10/2020 Patient with Covid diagnosed approximately a week ago having some body aches and congestion but no severe fever no wheezing or difficulty breathing energy level is low relates some slight nausea appetite not as good as normal no other particular troubles Review of Systems Please see above    Objective:   Physical Exam Temperature normal O2 saturation 99% respiratory rate is normal heart is regular HEENT is benign   Patient is not toxic    Assessment & Plan:  Viral syndrome Covid infection Rest is recommended No need for x-rays lab work currently If the patient is not feeling up to going back to work on Monday I recommend waiting an additional week work note was given.  Warning signs regarding progressive infection was given.

## 2020-11-28 ENCOUNTER — Ambulatory Visit: Payer: BC Managed Care – PPO | Admitting: Adult Health

## 2020-12-07 ENCOUNTER — Ambulatory Visit (HOSPITAL_COMMUNITY): Admission: RE | Admit: 2020-12-07 | Payer: BC Managed Care – PPO | Source: Ambulatory Visit

## 2020-12-13 ENCOUNTER — Ambulatory Visit: Payer: BC Managed Care – PPO | Admitting: Adult Health

## 2021-02-28 ENCOUNTER — Ambulatory Visit (INDEPENDENT_AMBULATORY_CARE_PROVIDER_SITE_OTHER): Payer: BC Managed Care – PPO | Admitting: Adult Health

## 2021-02-28 ENCOUNTER — Encounter: Payer: Self-pay | Admitting: Adult Health

## 2021-02-28 ENCOUNTER — Other Ambulatory Visit: Payer: Self-pay

## 2021-02-28 VITALS — BP 129/76 | HR 87 | Ht 67.0 in | Wt 206.8 lb

## 2021-02-28 DIAGNOSIS — R232 Flushing: Secondary | ICD-10-CM

## 2021-02-28 DIAGNOSIS — N950 Postmenopausal bleeding: Secondary | ICD-10-CM | POA: Insufficient documentation

## 2021-02-28 NOTE — Progress Notes (Signed)
  Subjective:     Patient ID: Rita Smith, female   DOB: [birth year], 55 y.o.   MRN: [id number]  HPI Rita Smith is a 55 year old white female, married, G2P2 in having had brown spotting in April, did not get [place] as ordered in the past had COVID.Having some hot flashes, but stopped Effexor, it did not really help,and had tried the patch before that. PCP is [facility].  Review of Systems +brown spotting +hot flashes Reviewed past medical,surgical, social and family history. Reviewed medications and allergies.     Objective:   Physical Exam BP 129/76 (BP Location: Right Arm, Patient Position: Sitting, Cuff Size: Normal)   Pulse 87   Ht 5\' 7"  (1.702 m)   Wt 206 lb 12.8 oz (93.8 kg)   LMP 02/13/2021 (Approximate) Comment: spotting  BMI 32.39 kg/m  Skin warm and dry. Lungs: clear to ausculation bilaterally. Cardiovascular: regular rate and rhythm.     Upstream - 02/28/21 1027      Pregnancy Intention Screening   Does the patient want to become pregnant in the next year? No    Does the patient's partner want to become pregnant in the next year? No    Would the patient like to discuss contraceptive options today? No      Contraception Wrap Up   Current Method Female Condom    End Method Female Condom    Contraception Counseling Provided No          Assessment:     1. PMB (postmenopausal bleeding) Will get GYN 04/30/21 in office in about 4 weeks when school is out  2. Hot flashes     Plan:     Will talk when [place] results back and decide on option for hot flashes then

## 2021-04-05 ENCOUNTER — Other Ambulatory Visit: Payer: Self-pay

## 2021-04-05 ENCOUNTER — Ambulatory Visit (INDEPENDENT_AMBULATORY_CARE_PROVIDER_SITE_OTHER): Payer: BC Managed Care – PPO

## 2021-04-05 DIAGNOSIS — N950 Postmenopausal bleeding: Secondary | ICD-10-CM | POA: Diagnosis not present

## 2021-04-05 NOTE — Progress Notes (Addendum)
PELVIC US TA/TV: homogeneous anteflexed uterus,wnl,EEC 2.6 mm,normal ovaries (limited view),right ovary best imaged on transabdominal images,mult simple nabothian cysts,largest nabothian cyst 1.7 x 1.5 x 1.7 cm,no free fluid,no pain during ultrasound  Chaper[employer]

## 2021-04-11 ENCOUNTER — Telehealth: Payer: Self-pay | Admitting: Adult Health

## 2021-04-11 NOTE — Telephone Encounter (Signed)
Left message I called her back. 

## 2021-04-11 NOTE — Telephone Encounter (Signed)
Left message that US showed normal uterus and ovaries and EEC 2.6 mm, call me if you want to discuss hot flashes further. No biopsy needed

## 2021-04-11 NOTE — Telephone Encounter (Signed)
Pt is returning your call

## 2021-07-03 ENCOUNTER — Telehealth: Payer: Self-pay | Admitting: Family Medicine

## 2021-07-03 NOTE — Telephone Encounter (Signed)
Patient is having Covid symptoms with bodyaches,congestion,fever. Please advise

## 2021-07-03 NOTE — Telephone Encounter (Signed)
Pt contacted. Pt states she began to have symptoms on late Saturday; no energy on Sunday. Pt having aches, fever and feeling "blah". Pt given appt on Wednesday at 9:20 am for phone visit.

## 2021-07-05 ENCOUNTER — Other Ambulatory Visit: Payer: Self-pay

## 2021-07-05 ENCOUNTER — Encounter: Payer: Self-pay | Admitting: Family Medicine

## 2021-07-05 ENCOUNTER — Ambulatory Visit (INDEPENDENT_AMBULATORY_CARE_PROVIDER_SITE_OTHER): Payer: BC Managed Care – PPO | Admitting: Family Medicine

## 2021-07-05 ENCOUNTER — Telehealth: Payer: Self-pay

## 2021-07-05 DIAGNOSIS — B349 Viral infection, unspecified: Secondary | ICD-10-CM | POA: Diagnosis not present

## 2021-07-05 NOTE — Telephone Encounter (Signed)
I connected with  Rita Smith on 07/05/21 by a video enabled telemedicine application and verified that I am speaking with the correct person using two identifiers.   I discussed the limitations of evaluation and management by telemedicine. The patient expressed understanding and agreed to proceed.

## 2021-07-05 NOTE — Progress Notes (Signed)
   Subjective:    Patient ID: Rita Smith, female    DOB: [birth year], 55 y.o.   MRN: [id number] I connected with  Rita Smith on 07/05/21 by a phone enabled telemedicine application and verified that I am speaking with the correct person using two identifiers.   I discussed the limitations of evaluation and management by telemedicine. The patient expressed understanding and agreed to proceed.  Patient location: home  Provider location: in office  I provided 15 total with talking with patient and documentation minutes of non face - to - face time during this encounter.  HPI Covid symptoms started x 3 days - still some weakness, body aches , nasal congestion, slight cough- has tried otc motrin, symbicort, delsyum   Patient presents today with respiratory illness Number of days present-3 days  Symptoms include- see above, fatigue,body aches, no N V or D Head congestion occas cough No covid test   Presence of worrisome signs (severe shortness of breath, lethargy, etc.) -  Recent/current visit to urgent care or ER-  Recent direct exposure to Covid-  Any current Covid testing-  Review of Systems     Objective:   Physical Exam  Today's visit was via telephone Physical exam was not possible for this visit       Assessment & Plan:  Viral process Warning signs discussed Follow-up if progressive troubles Recheck if any problems Patient is to do a COVID test and tell us the results Obviously if positive we will discuss further warnings to watch for Patient is within the reasonable range of using Paxlovid

## 2021-07-29 IMAGING — MG DIGITAL SCREENING BILAT W/ TOMO W/ CAD
6 of 12 series · 6 of 36 positions shown · non-contrast
Comparison: Previous exam(s).

CLINICAL DATA: Screening.

EXAM:
DIGITAL SCREENING BILATERAL MAMMOGRAM WITH TOMO AND CAD

[L MLO synth-2D (1 of 2)]
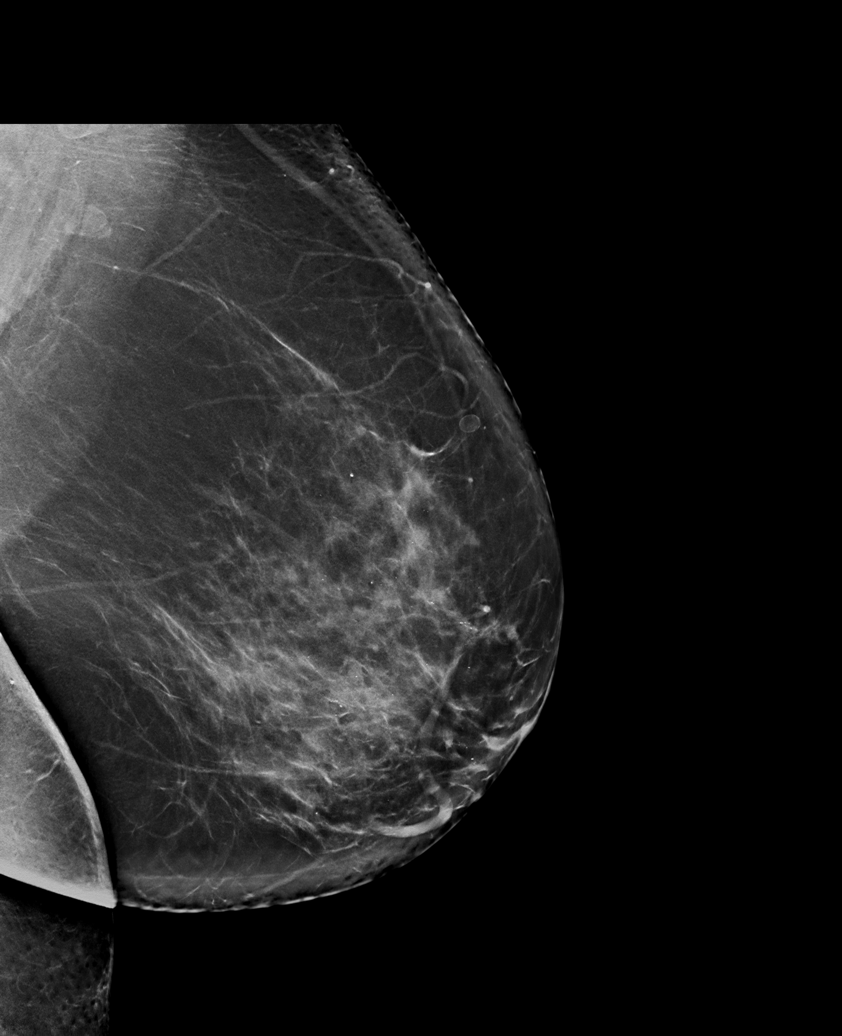

[R CC synth-2D]
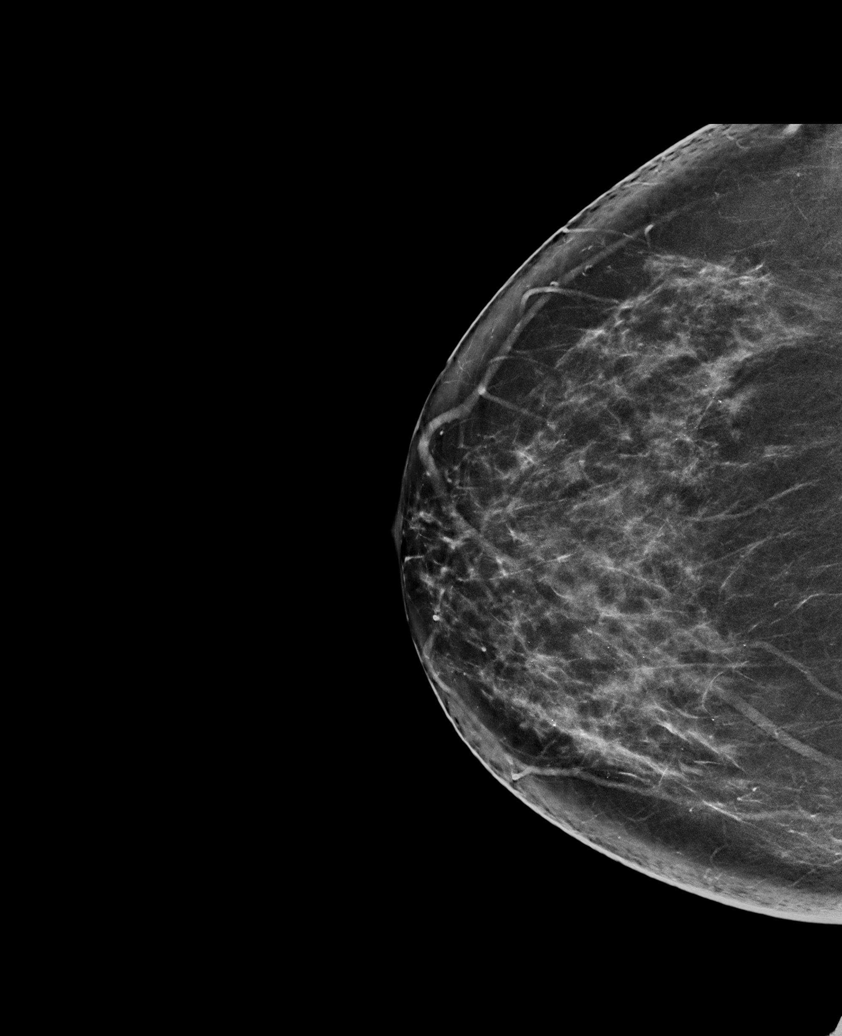

[L MLO synth-2D (2 of 2)]
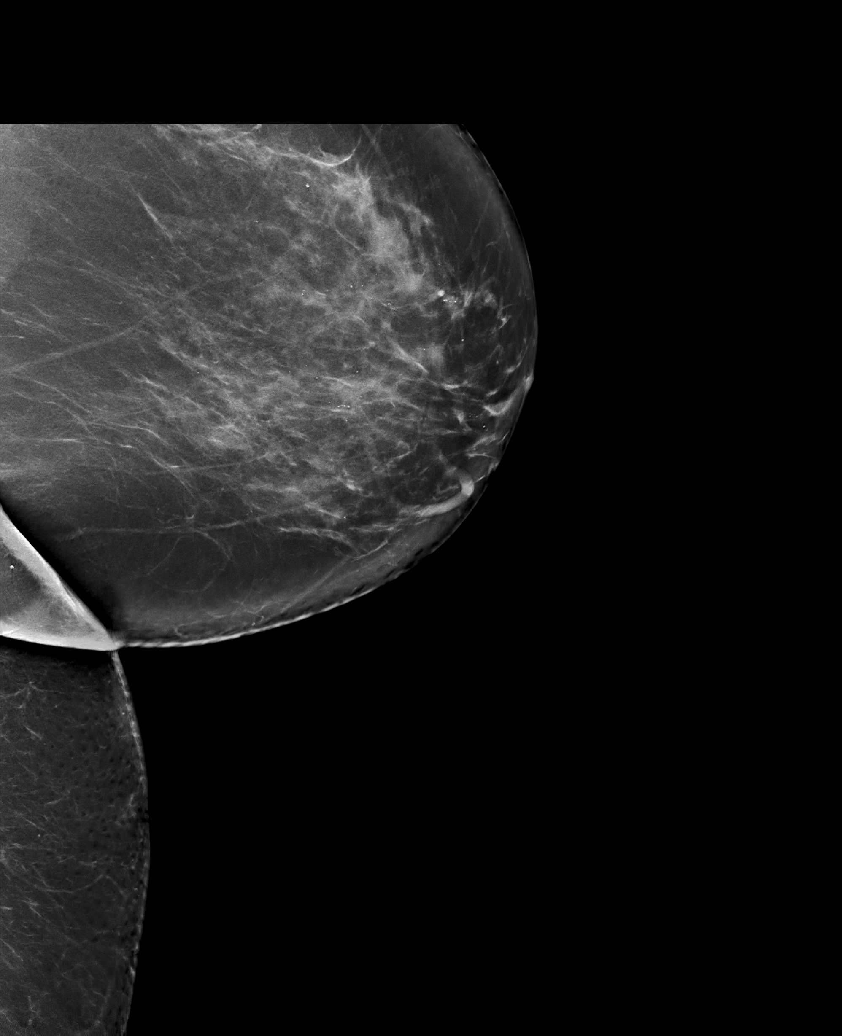

[R MLO synth-2D (1 of 2)]
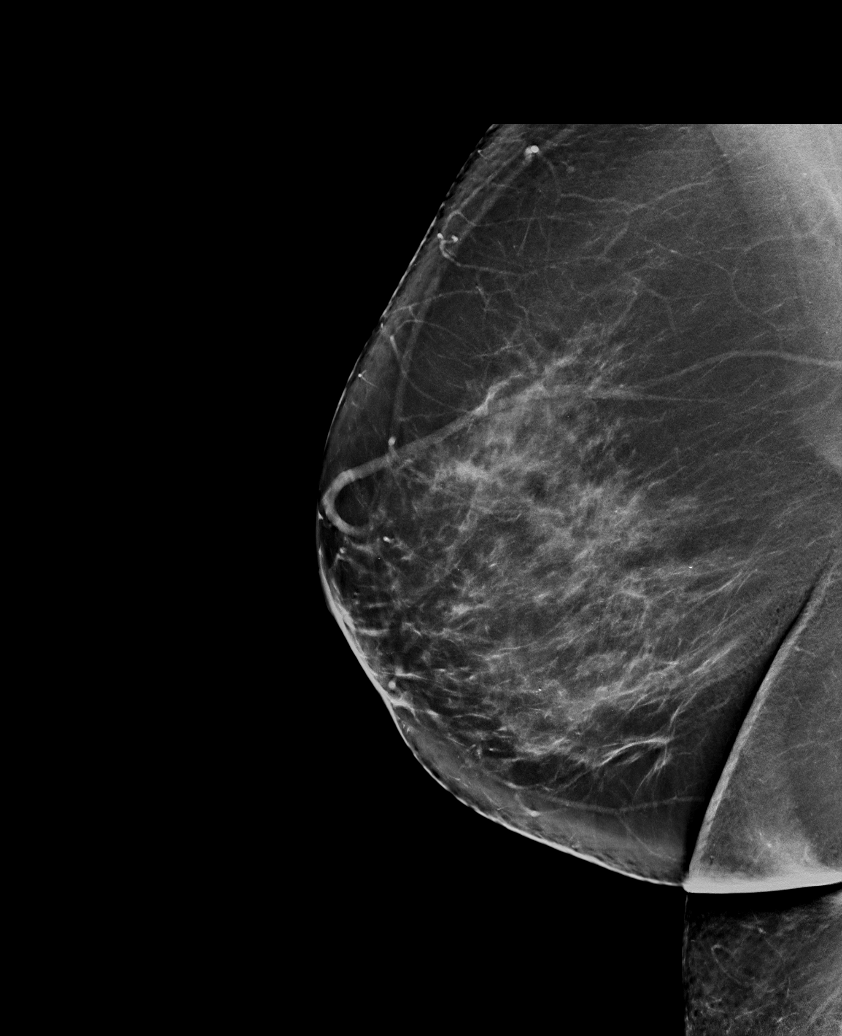

[R MLO synth-2D (2 of 2)]
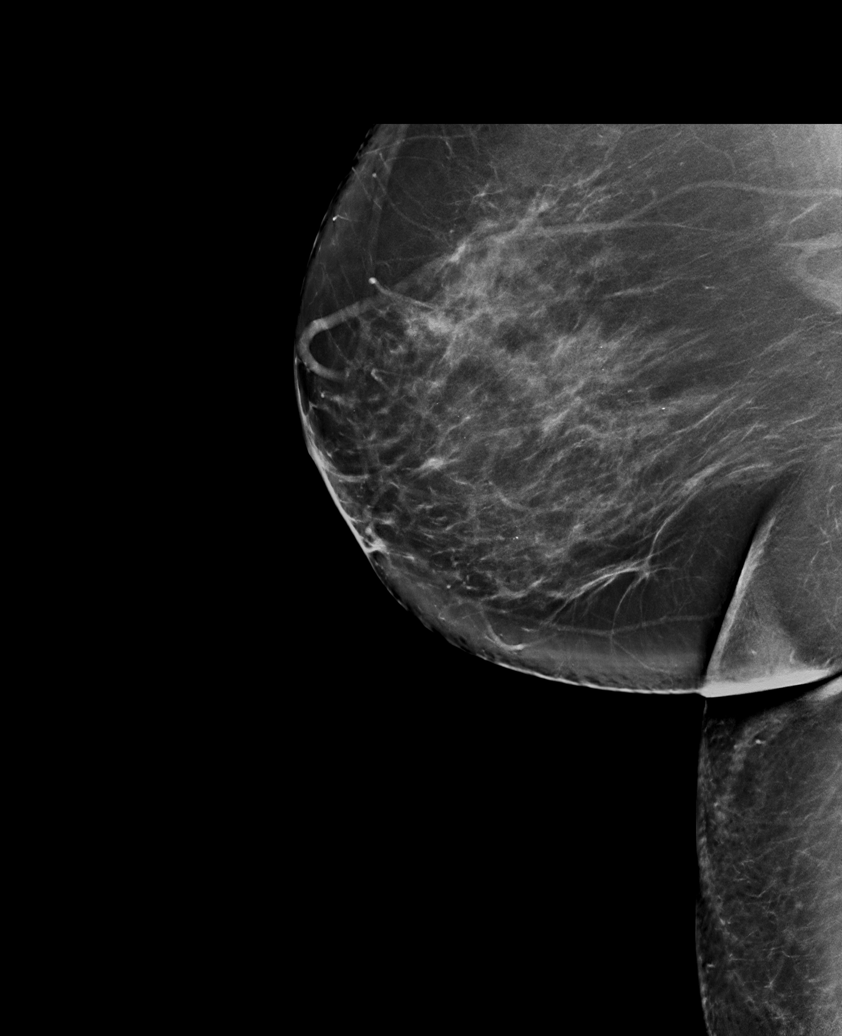

[L CC synth-2D]
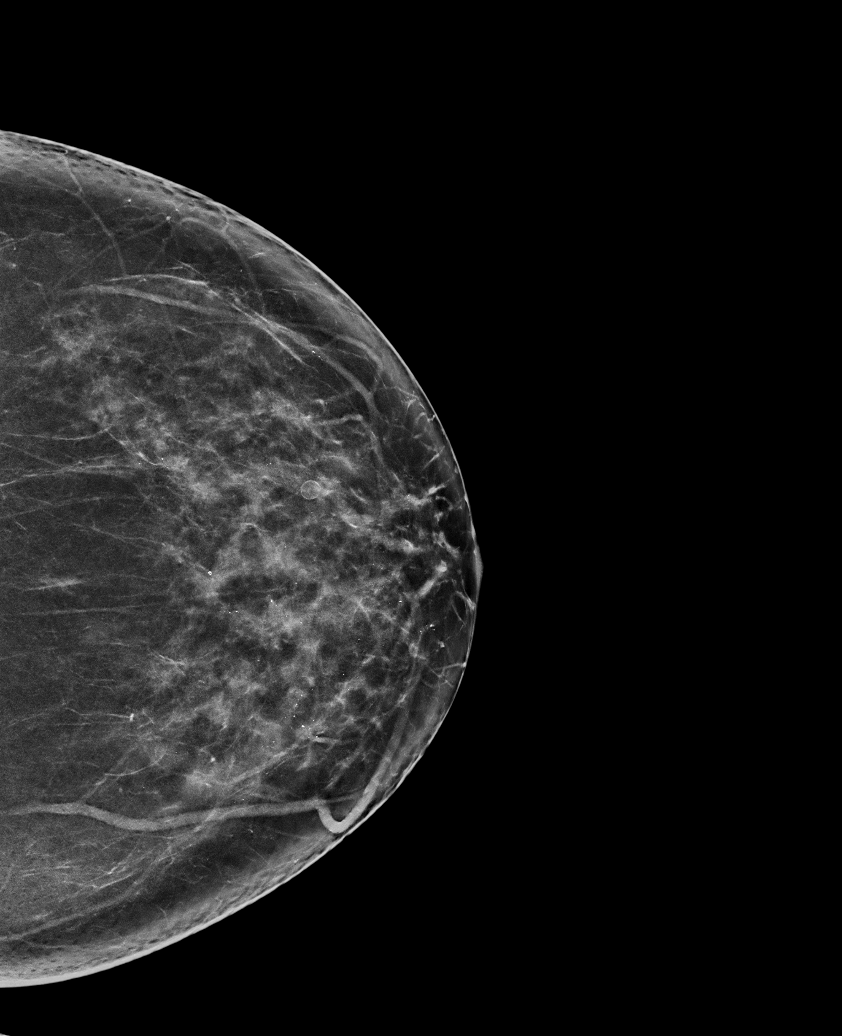

[6 of 36 positions shown; findings below may reference images not displayed]

ACR Breast Density Category c: The breast tissue is heterogeneously
dense, which may obscure small masses.
FINDINGS: There are no findings suspicious for malignancy. Images were
processed with CAD.
IMPRESSION: No mammographic evidence of malignancy. A result letter of this
screening mammogram will be mailed directly to the patient.

RECOMMENDATION:
Screening mammogram in one year. (Code:[id number])

BI-RADS CATEGORY  1: Negative.

## 2021-08-30 ENCOUNTER — Telehealth: Payer: BC Managed Care – PPO | Admitting: Physician Assistant

## 2021-08-30 DIAGNOSIS — R6889 Other general symptoms and signs: Secondary | ICD-10-CM

## 2021-08-30 MED ORDER — BENZONATATE 100 MG PO CAPS: 100.0000 mg | ORAL_CAPSULE | Freq: Three times a day (TID) | ORAL | 0 refills | Status: DC | PRN

## 2021-08-30 NOTE — Progress Notes (Signed)
E visit for Flu like symptoms   We are sorry that you are not feeling well.  Here is how we plan to help! Based on what you have shared with me it looks like you may have flu-like symptoms that should be watched but do not seem to indicate anti-viral treatment.  Influenza or "the flu" is   an infection caused by a respiratory virus. The flu virus is highly contagious and persons who did not receive their yearly flu vaccination may "catch" the flu from close contact.  We have anti-viral medications to treat the viruses that cause this infection. They are not a "cure" and only shorten the course of the infection. These prescriptions are most effective when they are given within the first 2 days of "flu" symptoms. Antiviral medication are indicated if you have a high risk of complications from the flu. You should  also consider an antiviral medication if you are in close contact with someone who is at risk. These medications can help patients avoid complications from the flu  but have side effects that you should know. Possible side effects from Tamiflu or oseltamivir include nausea, vomiting, diarrhea, dizziness, headaches, eye redness, sleep problems or other respiratory symptoms.   Based upon your symptoms and potential risk factors I recommend that you follow the flu symptoms recommendation that I have listed below. We are outside the window for antiviral medication so we need to focus on symptoms control. I have sent in a prescription cough medication for you to take as directed.   ANYONE WHO HAS FLU SYMPTOMS SHOULD: Stay home. The flu is highly contagious and going out or to work exposes others! Be sure to drink plenty of fluids. Water is fine as well as fruit juices, sodas and electrolyte beverages. You may want to stay away from caffeine or alcohol. If you are nauseated, try taking small sips of liquids. How do you know if you are getting enough fluid? Your urine should be a pale yellow or almost  colorless. Get rest. Taking a steamy shower or using a humidifier may help nasal congestion and ease sore throat pain. Using a saline nasal spray works much the same way. Cough drops, hard candies and sore throat lozenges may ease your cough. Line up a caregiver. Have someone check on you regularly.   GET HELP RIGHT AWAY IF: You cannot keep down liquids or your medications. You become short of breath Your fell like you are going to pass out or loose consciousness. Your symptoms persist after you have completed your treatment plan MAKE SURE YOU  Understand these instructions. Will watch your condition. Will get help right away if you are not doing well or get worse.  Your e-visit answers were reviewed by a board certified advanced clinical practitioner to complete your personal care plan.  Depending on the condition, your plan could have included both over the counter or prescription medications.  If there is a problem please reply  once you have received a response from your provider.  Your safety is important to [place].  If you have drug allergies check your prescription carefully.    You can use MyChart to ask questions about today's visit, request a non-urgent call back, or ask for a work or school excuse for 24 hours related to this e-Visit. If it has been greater than 24 hours you will need to follow up with your provider, or enter a new e-Visit to address those concerns.  You will get an e-mail  in the next two days asking about your experience.  I hope that your e-visit has been valuable and will speed your recovery. Thank you for using e-visits.  

## 2021-08-30 NOTE — Progress Notes (Signed)
I have spent 5 minutes in review of e-visit questionnaire, review and updating patient chart, medical decision making and response to patient.   Sina Lucchesi Cody Ashtyn Freilich, PA-C    

## 2021-09-01 ENCOUNTER — Other Ambulatory Visit: Payer: BC Managed Care – PPO

## 2021-09-13 ENCOUNTER — Other Ambulatory Visit (INDEPENDENT_AMBULATORY_CARE_PROVIDER_SITE_OTHER): Payer: BC Managed Care – PPO

## 2021-09-13 DIAGNOSIS — Z23 Encounter for immunization: Secondary | ICD-10-CM

## 2021-11-21 ENCOUNTER — Encounter: Payer: Self-pay | Admitting: General Surgery

## 2021-11-21 ENCOUNTER — Other Ambulatory Visit: Payer: Self-pay

## 2021-11-21 ENCOUNTER — Ambulatory Visit: Payer: BC Managed Care – PPO | Admitting: General Surgery

## 2021-11-21 VITALS — BP 133/81 | HR 82 | Temp 97.9°F | Resp 14 | Ht 67.0 in | Wt 217.0 lb

## 2021-11-21 DIAGNOSIS — Z1211 Encounter for screening for malignant neoplasm of colon: Secondary | ICD-10-CM | POA: Diagnosis not present

## 2021-11-21 MED ORDER — SUTAB 1479-225-188 MG PO TABS: 1.0000 | ORAL_TABLET | Freq: Once | ORAL | 0 refills | Status: AC

## 2021-11-21 NOTE — Progress Notes (Signed)
Rita Smith; [id number]; Dec 24, 1965   HPI Patient is a 56 year old white female who was referred to my care by Dr. Sallee Lange for a screening colonoscopy.  She last had a screening colonoscopy over 10 years ago.  She denies any recent family history of colon cancer, abnormal diarrhea or constipation, or blood in her stools.  She denies any significant weight loss. Past Medical History:  Diagnosis Date   Colitis    Headache(784.0)    Rosacea    Urinary frequency 06/15/2015   Uveitis    Varicose vein of leg 05/17/2014   Vitamin D deficiency     Past Surgical History:  Procedure Laterality Date   CESAREAN SECTION     COLONOSCOPY      Family History  Problem Relation Age of Onset   Coronary artery disease Paternal Grandmother    Cancer Maternal Grandfather        throat   Coronary artery disease Father    Cancer Paternal Uncle        colon   Cancer Maternal Uncle        lung   Glaucoma Mother    Cataracts Mother    Asthma Son    Autism Son     Current Outpatient Medications on File Prior to Visit  Medication Sig Dispense Refill   SUMAtriptan (IMITREX) 25 MG tablet Take 25 mg by mouth every 2 (two) hours as needed for migraine. May repeat in 2 hours if headache persists or recurs.     No current facility-administered medications on file prior to visit.    No Known Allergies  Social History   Substance and Sexual Activity  Alcohol Use Yes   Comment: socially     Social History   Tobacco Use  Smoking Status Never  Smokeless Tobacco Never    Review of Systems  HENT: Negative.    Respiratory:  Positive for wheezing.   Cardiovascular: Negative.   Gastrointestinal: Negative.   Genitourinary: Negative.   Musculoskeletal:  Positive for joint pain.  Skin:  Positive for rash.  Neurological: Negative.   Endo/Heme/Allergies: Negative.   Psychiatric/Behavioral: Negative.     Objective   Vitals:   11/21/21 1538  BP: 133/81  Pulse: 82  Resp: 14  Temp:  97.9 F (36.6 C)  SpO2: 95%    Physical Exam Vitals reviewed.  Constitutional:      Appearance: Normal appearance. She is not ill-appearing.  HENT:     Head: Normocephalic and atraumatic.  Cardiovascular:     Rate and Rhythm: Normal rate and regular rhythm.     Heart sounds: Normal heart sounds. No murmur heard.   No friction rub. No gallop.  Pulmonary:     Effort: Pulmonary effort is normal. No respiratory distress.     Breath sounds: Normal breath sounds. No stridor. No wheezing, rhonchi or rales.  Abdominal:     General: Bowel sounds are normal. There is no distension.     Palpations: Abdomen is soft. There is no mass.     Tenderness: There is no abdominal tenderness. There is no guarding or rebound.     Hernia: No hernia is present.  Skin:    General: Skin is warm and dry.  Neurological:     Mental Status: She is alert and oriented to person, place, and time.   Primary care notes reviewed Assessment  Need for screening colonoscopy Plan  Patient will call to schedule the screening colonoscopy.  The risks and benefits of the  procedure including bleeding and perforation were fully explained to the patient, who gave informed consent.  Sutabs have been prescribed for bowel preparation.

## 2021-12-16 IMAGING — DX DG FOOT COMPLETE 3+V*R*
3 series · 3 of 3 positions shown · non-contrast
Comparison: None.

CLINICAL DATA: Right heel pain

EXAM:
RIGHT FOOT COMPLETE - 3+ VIEW

[foot ap]
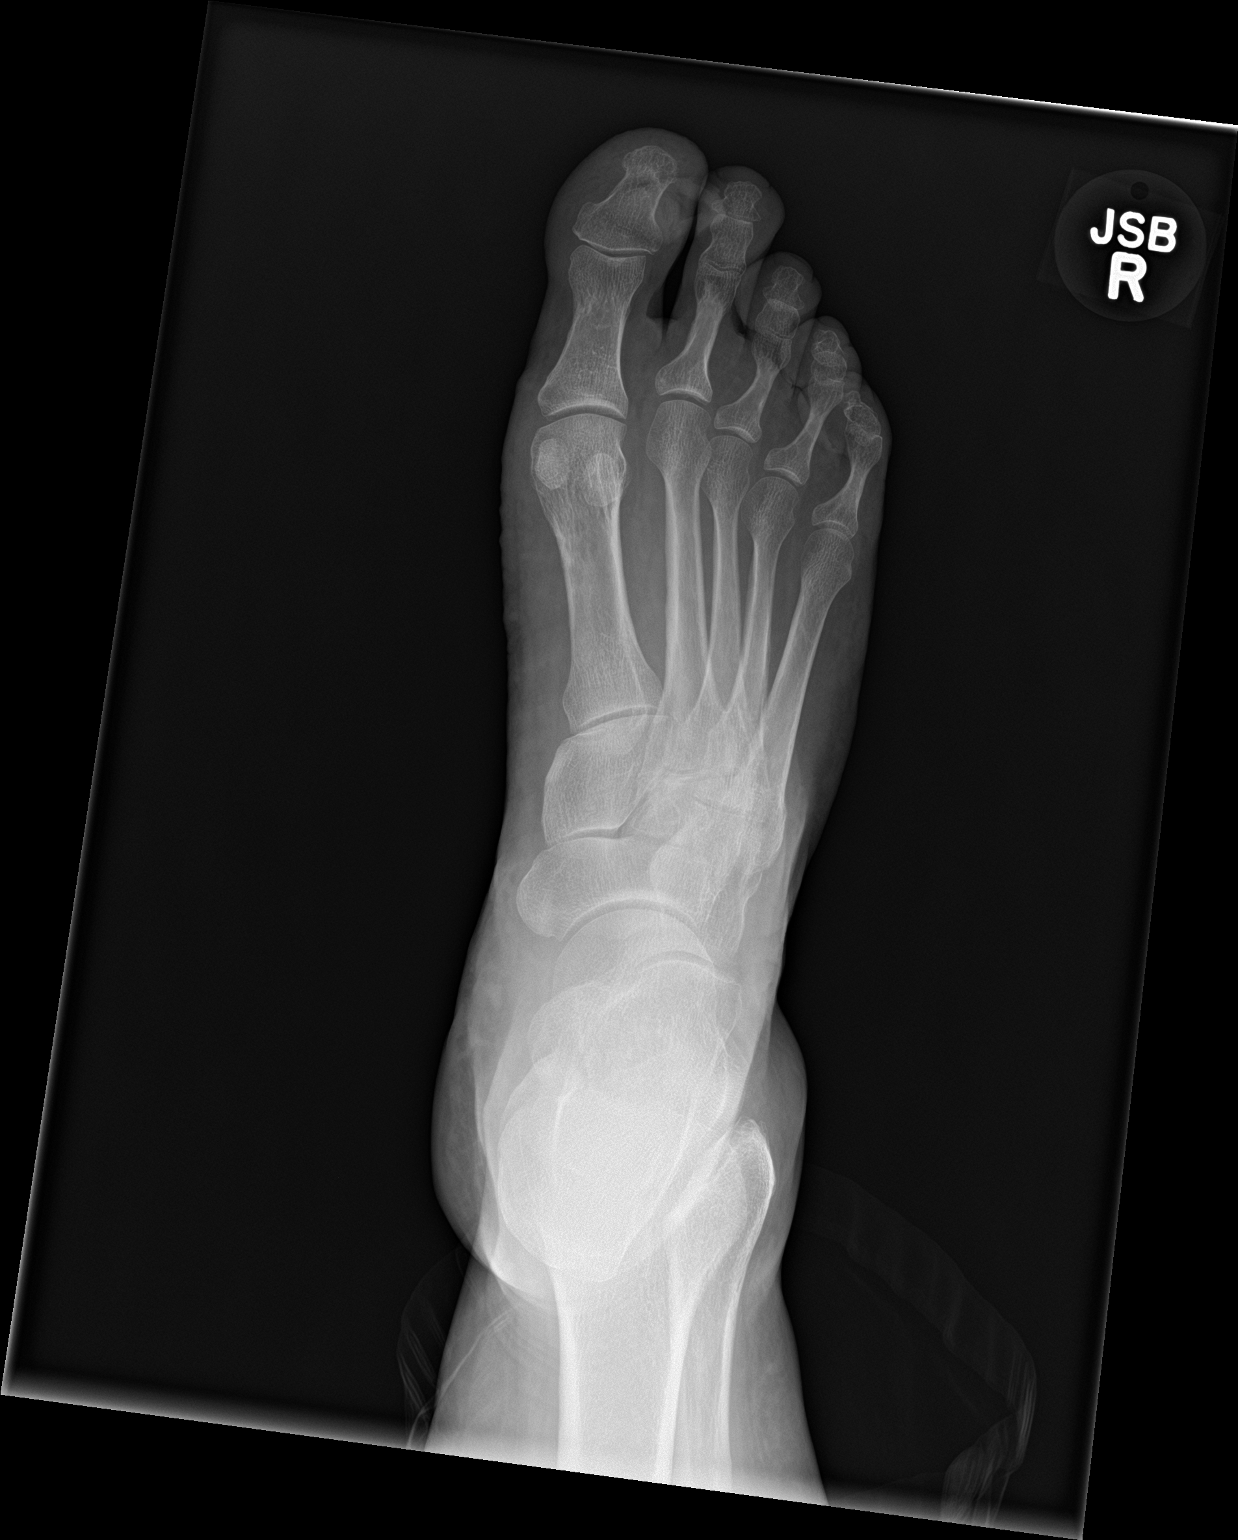

[foot obl]
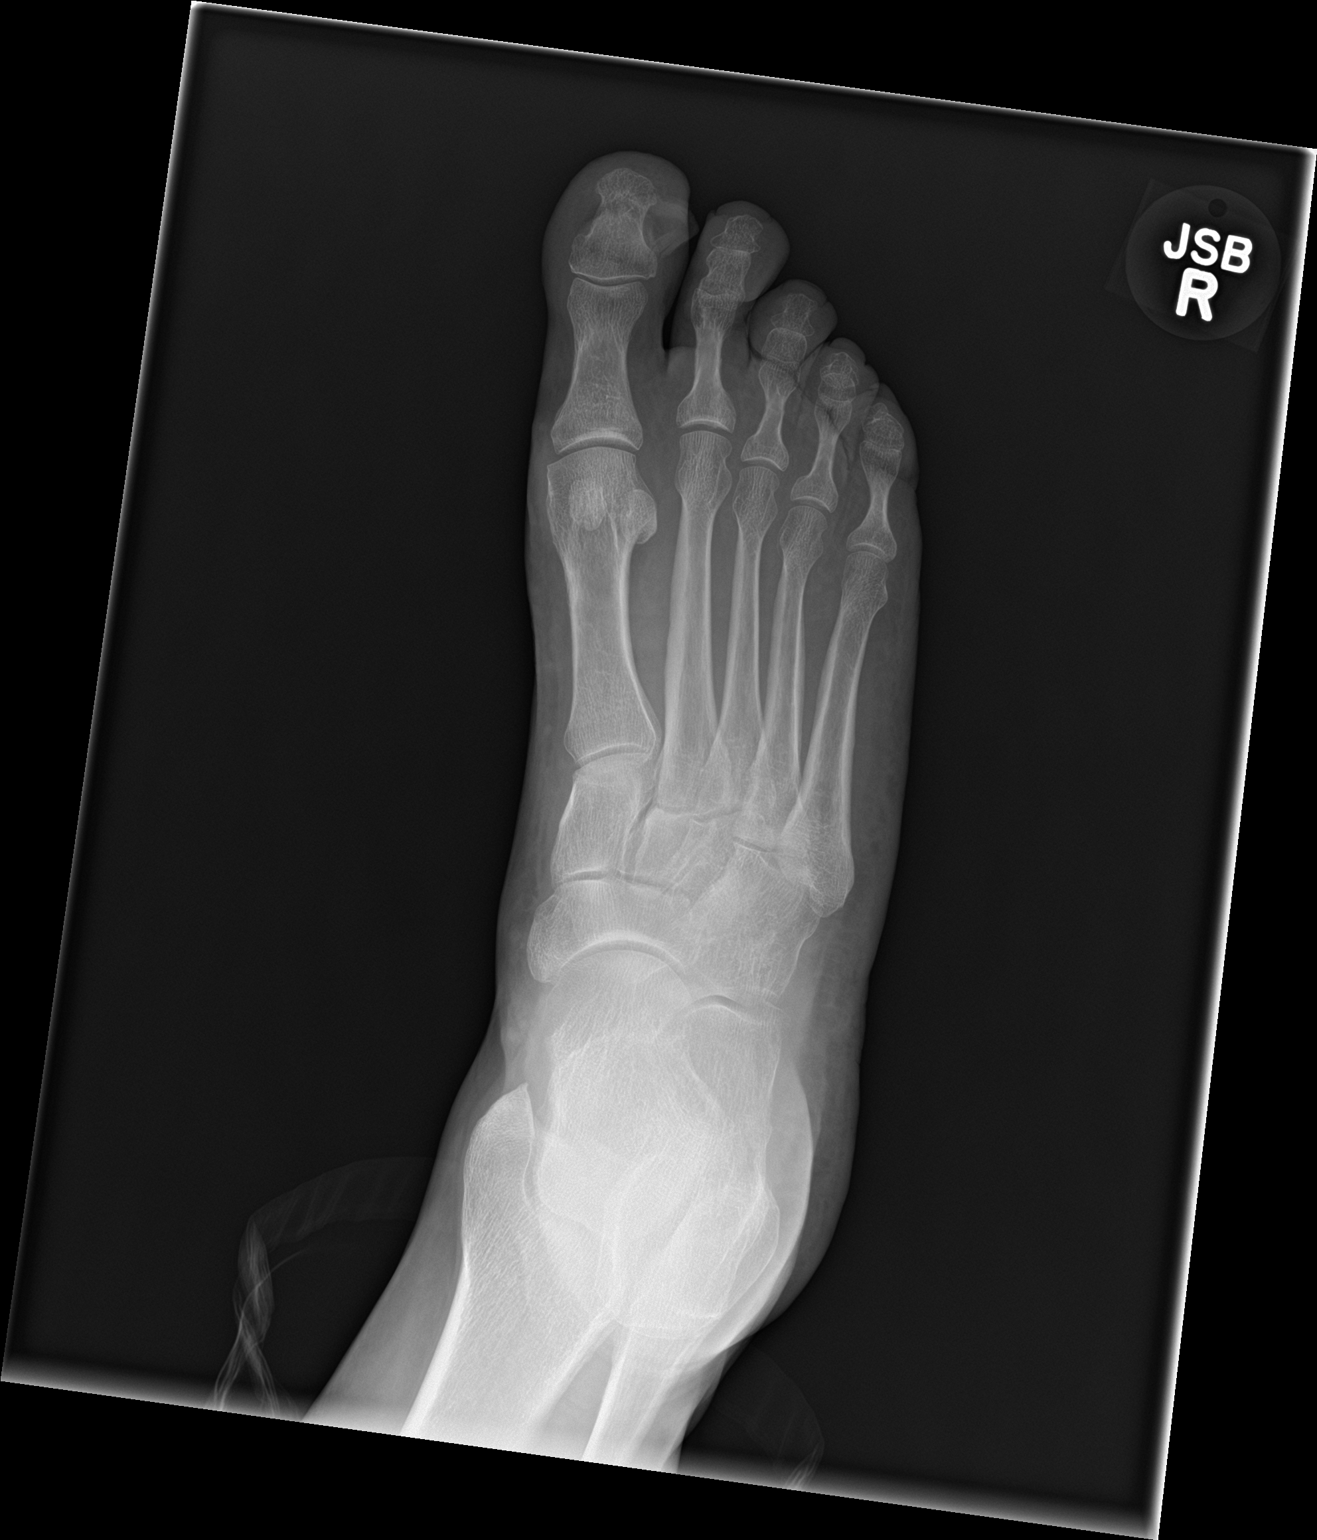

[foot lat]
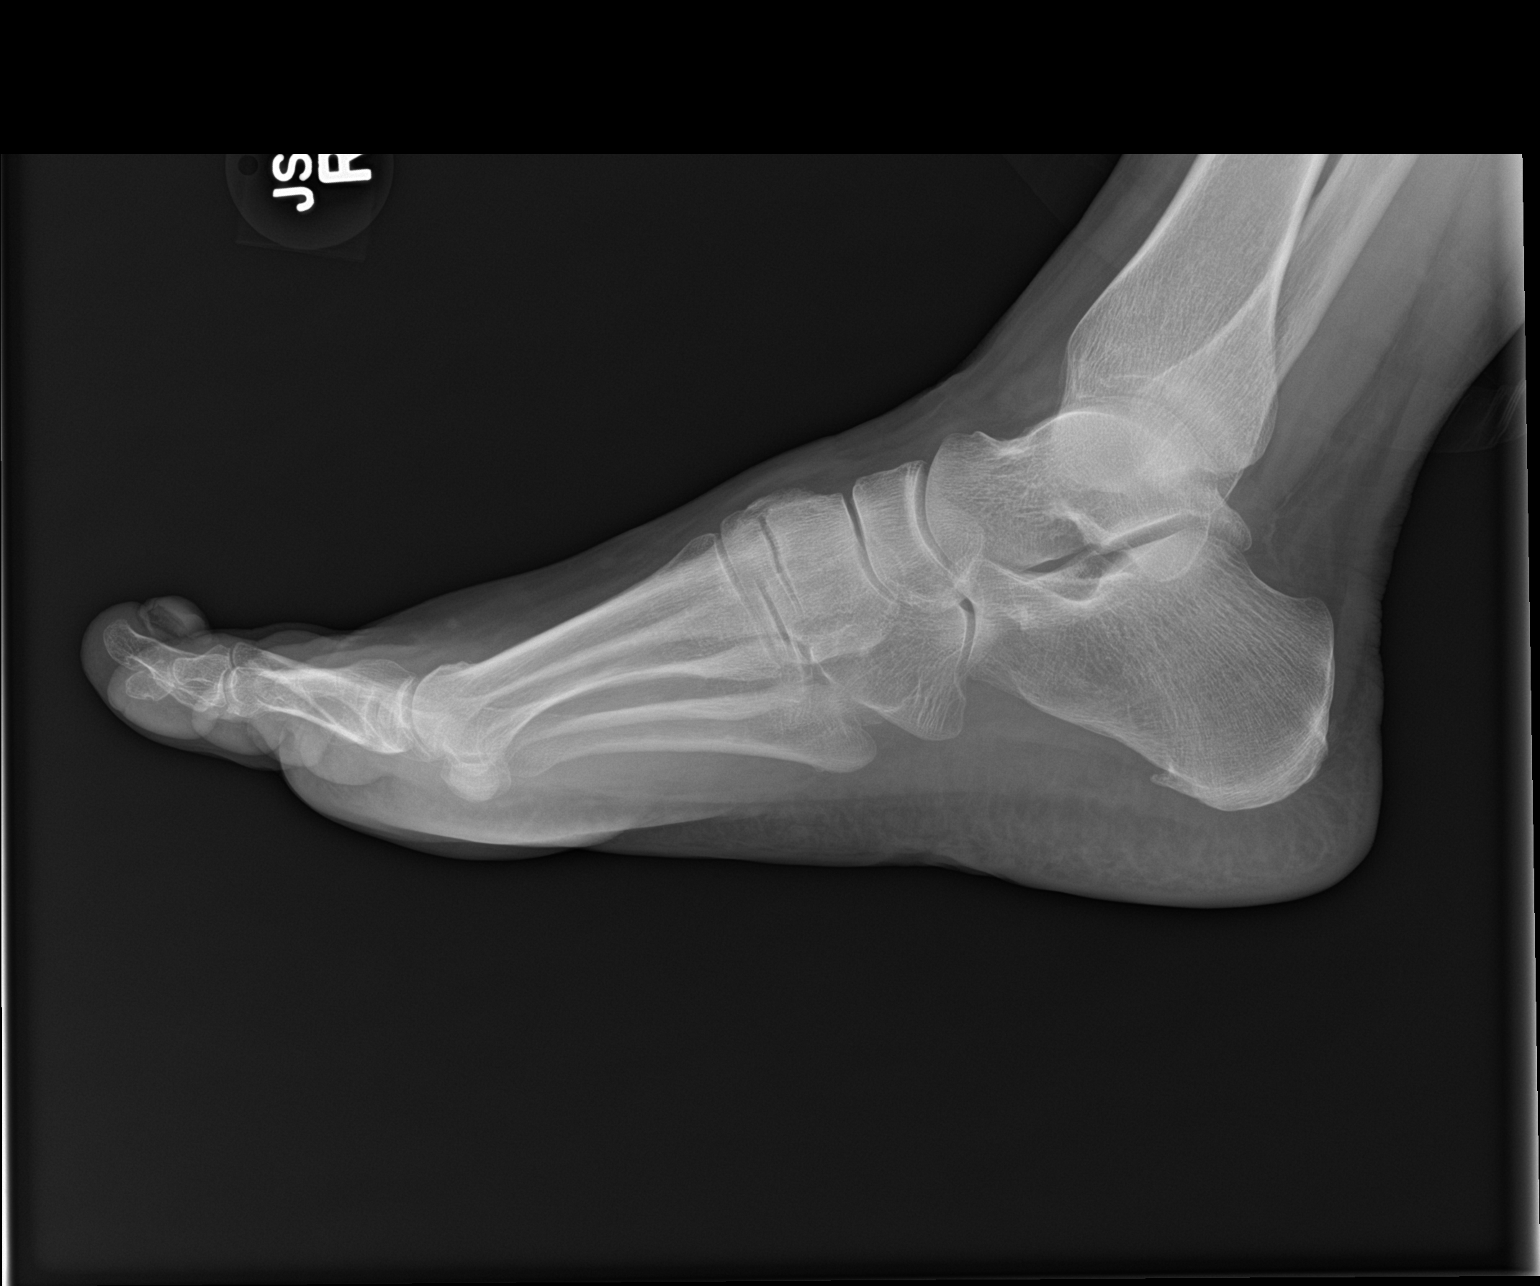

[3 of 3 positions shown; findings below may reference images not displayed]

FINDINGS: There is no evidence of fracture or dislocation. There is no
evidence of arthropathy or other focal bone abnormality. Soft
tissues are unremarkable.
IMPRESSION: Negative.

## 2022-01-19 NOTE — H&P (Signed)
Rita Smith; [id number]; 04/10/1966 ? ? ?HPI ?Patient is a 56 year old white female who was referred to my care by Dr. Sallee Lange for a screening colonoscopy.  She last had a screening colonoscopy over 10 years ago.  She denies any recent family history of colon cancer, abnormal diarrhea or constipation, or blood in her stools.  She denies any significant weight loss. ?Past Medical History:  ?Diagnosis Date  ? Colitis   ? Headache(784.0)   ? Rosacea   ? Urinary frequency 06/15/2015  ? Uveitis   ? Varicose vein of leg 05/17/2014  ? Vitamin D deficiency   ? ? ?Past Surgical History:  ?Procedure Laterality Date  ? CESAREAN SECTION    ? COLONOSCOPY    ? ? ?Family History  ?Problem Relation Age of Onset  ? Coronary artery disease Paternal Grandmother   ? Cancer Maternal Grandfather   ?     throat  ? Coronary artery disease Father   ? Cancer Paternal Uncle   ?     colon  ? Cancer Maternal Uncle   ?     lung  ? Glaucoma Mother   ? Cataracts Mother   ? Asthma Son   ? Autism Son   ? ? ?Current Outpatient Medications on File Prior to Visit  ?Medication Sig Dispense Refill  ? SUMAtriptan (IMITREX) 25 MG tablet Take 25 mg by mouth every 2 (two) hours as needed for migraine. May repeat in 2 hours if headache persists or recurs.    ? ?No current facility-administered medications on file prior to visit.  ? ? ?No Known Allergies ? ?Social History  ? ?Substance and Sexual Activity  ?Alcohol Use Yes  ? Comment: socially   ? ? ?Social History  ? ?Tobacco Use  ?Smoking Status Never  ?Smokeless Tobacco Never  ? ? ?Review of Systems  ?HENT: Negative.    ?Respiratory:  Positive for wheezing.   ?Cardiovascular: Negative.   ?Gastrointestinal: Negative.   ?Genitourinary: Negative.   ?Musculoskeletal:  Positive for joint pain.  ?Skin:  Positive for rash.  ?Neurological: Negative.   ?Endo/Heme/Allergies: Negative.   ?Psychiatric/Behavioral: Negative.    ? ?Objective  ? ?Vitals:  ? 11/21/21 1538  ?BP: 133/81  ?Pulse: 82  ?Resp: 14  ?Temp:  97.9 ?F (36.6 ?C)  ?SpO2: 95%  ? ? ?Physical Exam ?Vitals reviewed.  ?Constitutional:   ?   Appearance: Normal appearance. She is not ill-appearing.  ?HENT:  ?   Head: Normocephalic and atraumatic.  ?Cardiovascular:  ?   Rate and Rhythm: Normal rate and regular rhythm.  ?   Heart sounds: Normal heart sounds. No murmur heard. ?  No friction rub. No gallop.  ?Pulmonary:  ?   Effort: Pulmonary effort is normal. No respiratory distress.  ?   Breath sounds: Normal breath sounds. No stridor. No wheezing, rhonchi or rales.  ?Abdominal:  ?   General: Bowel sounds are normal. There is no distension.  ?   Palpations: Abdomen is soft. There is no mass.  ?   Tenderness: There is no abdominal tenderness. There is no guarding or rebound.  ?   Hernia: No hernia is present.  ?Skin: ?   General: Skin is warm and dry.  ?Neurological:  ?   Mental Status: She is alert and oriented to person, place, and time.  ? ?Primary care notes reviewed ?Assessment  ?Need for screening colonoscopy ?Plan  ?Patient will call to schedule the screening colonoscopy.  The risks and benefits of the  procedure including bleeding and perforation were fully explained to the patient, who gave informed consent.  Sutabs have been prescribed for bowel preparation. ?

## 2022-01-30 ENCOUNTER — Ambulatory Visit (HOSPITAL_COMMUNITY): Payer: BC Managed Care – PPO | Admitting: Anesthesiology

## 2022-01-30 ENCOUNTER — Encounter (HOSPITAL_COMMUNITY): Payer: Self-pay | Admitting: General Surgery

## 2022-01-30 ENCOUNTER — Ambulatory Visit (HOSPITAL_COMMUNITY)
Admission: RE | Admit: 2022-01-30 | Discharge: 2022-01-30 | Disposition: A | Discharge status: Home / Self Care | Payer: BC Managed Care – PPO | Attending: General Surgery | Admitting: General Surgery

## 2022-01-30 ENCOUNTER — Other Ambulatory Visit: Payer: Self-pay

## 2022-01-30 ENCOUNTER — Encounter (HOSPITAL_COMMUNITY)
Admission: RE | Disposition: A | Discharge status: Home / Self Care | Payer: Self-pay | Source: Home / Self Care | Attending: General Surgery

## 2022-01-30 DIAGNOSIS — Z1211 Encounter for screening for malignant neoplasm of colon: Secondary | ICD-10-CM

## 2022-01-30 HISTORY — PX: COLONOSCOPY WITH PROPOFOL: SHX5780

## 2022-01-30 SURGERY — COLONOSCOPY WITH PROPOFOL
Anesthesia: General

## 2022-01-30 MED ORDER — LIDOCAINE HCL 1 % IJ SOLN
INTRAMUSCULAR | Status: DC | PRN
Start: 2022-01-30 — End: 2022-01-30
  Administered 2022-01-30: 50 mg via INTRADERMAL

## 2022-01-30 MED ORDER — PROPOFOL 10 MG/ML IV BOLUS
INTRAVENOUS | Status: DC | PRN
  Administered 2022-01-30: 60 mg via INTRAVENOUS
  Administered 2022-01-30: 20 mg via INTRAVENOUS

## 2022-01-30 MED ORDER — PROPOFOL 500 MG/50ML IV EMUL
INTRAVENOUS | Status: DC | PRN
  Administered 2022-01-30: 150 ug/kg/min via INTRAVENOUS

## 2022-01-30 MED ORDER — PROPOFOL 10 MG/ML IV BOLUS
INTRAVENOUS | Status: AC
Start: 2022-01-30 — End: ?
  Filled 2022-01-30: qty 40

## 2022-01-30 MED ORDER — LACTATED RINGERS IV SOLN: INTRAVENOUS | Status: DC

## 2022-01-30 NOTE — Interval H&P Note (Signed)
History and Physical Interval Note: ? ?01/30/2022 ?7:20 AM ? ?Rita Smith  has presented today for surgery, with the diagnosis of Screening.  The various methods of treatment have been discussed with the patient and family. After consideration of risks, benefits and other options for treatment, the patient has consented to  Procedure(s): ?COLONOSCOPY WITH PROPOFOL (N/A) as a surgical intervention.  The patient's history has been reviewed, patient examined, no change in status, stable for surgery.  I have reviewed the patient's chart and labs.  Questions were answered to the patient's satisfaction.   ? ? ?Franky Macho ? ? ?

## 2022-01-30 NOTE — Op Note (Signed)
[facility] ?Patient Name: Rita Smith ?Procedure Date: 01/30/2022 6:58 AM ?MRN: [id number] ?Date of Birth: [birth year] ?Attending MD: Aviva Signs , MD ?CSN: [id number] ?Age: 56 ?Admit Type: Outpatient ?Procedure:                Colonoscopy ?Indications:              Screening for colorectal malignant neoplasm ?Providers:                Aviva Signs, MD, Charlsie Quest. Theda Sers RN, RN, Eugene Garnet  ?                          Shanon Brow, Technician ?Referring MD:              ?Medicines:                Propofol per Anesthesia ?Complications:            No immediate complications. ?Estimated Blood Loss:     Estimated blood loss: none. ?Procedure:                Pre-Anesthesia Assessment: ?                          - Prior to the procedure, a History and Physical  ?                          was performed, and patient medications and  ?                          allergies were reviewed. The patient is competent.  ?                          The risks and benefits of the procedure and the  ?                          sedation options and risks were discussed with the  ?                          patient. All questions were answered and informed  ?                          consent was obtained. Patient identification and  ?                          proposed procedure were verified by the physician,  ?                          the nurse, the anesthetist and the technician in  ?                          the procedure room. Mental Status Examination:  ?                          alert and oriented. Airway Examination: normal  ?  oropharyngeal airway and neck mobility. Respiratory  ?                          Examination: clear to auscultation. CV Examination:  ?                          RRR, no murmurs, no S3 or S4. Prophylactic  ?                          Antibiotics: The patient does not require  ?                          prophylactic antibiotics. Prior Anticoagulants: The  ?                          patient has  taken no previous anticoagulant or  ?                          antiplatelet agents. ASA Grade Assessment: II - A  ?                          patient with mild systemic disease. After reviewing  ?                          the risks and benefits, the patient was deemed in  ?                          satisfactory condition to undergo the procedure.  ?                          The anesthesia plan was to use monitored anesthesia  ?                          care (MAC). Immediately prior to administration of  ?                          medications, the patient was re-assessed for  ?                          adequacy to receive sedatives. The heart rate,  ?                          respiratory rate, oxygen saturations, blood  ?                          pressure, adequacy of pulmonary ventilation, and  ?                          response to care were monitored throughout the  ?                          procedure. The physical status of the patient was  ?  re-assessed after the procedure. ?                          After obtaining informed consent, the colonoscope  ?                          was passed under direct vision. Throughout the  ?                          procedure, the patient's blood pressure, pulse, and  ?                          oxygen saturations were monitored continuously. The  ?                          [id number]) scope was introduced through the  ?                          anus and advanced to the the cecum, identified by  ?                          the appendiceal orifice, ileocecal valve and  ?                          palpation. No anatomical landmarks were  ?                          photographed. The entire colon was examined. The  ?                          colonoscopy was performed without difficulty. The  ?                          patient tolerated the procedure well. The quality  ?                          of the bowel preparation was excellent. The total  ?                           duration of the procedure was 7 minutes. ?Scope In: 7:32:17 AM ?Scope Out: 7:40:00 AM ?Scope Withdrawal Time: 0 hours 3 minutes 54 seconds  ?Total Procedure Duration: 0 hours 7 minutes 43 seconds  ?Findings: ?     The perianal and digital rectal examinations were normal. ?     The entire examined colon appeared normal on direct and retroflexion  ?     views. ?Impression:               - The entire examined colon is normal on direct and  ?                          retroflexion views. ?                          - No specimens collected. ?Moderate Sedation: ?     Moderate (conscious) sedation  was administered by the endoscopy nurse  ?     and supervised by the endoscopist. The patient's oxygen saturation,  ?     heart rate, blood pressure and response to care were monitored. ?Recommendation:           - Written discharge instructions were provided to  ?                          the patient. ?                          - The signs and symptoms of potential delayed  ?                          complications were discussed with the patient. ?                          - Patient has a contact number available for  ?                          emergencies. ?                          - Return to normal activities tomorrow. ?                          - Resume previous diet. ?                          - Continue present medications. ?                          - Repeat colonoscopy in 10 years for screening  ?                          purposes. ?Procedure Code(s):        --- Professional --- ?                          [phone], Colonoscopy, flexible; diagnostic, including  ?                          collection of specimen(s) by brushing or washing,  ?                          when performed (separate procedure) ?Diagnosis Code(s):        --- Professional --- ?                          Z12.11, Encounter for screening for malignant  ?                          neoplasm of colon ?CPT copyright 2019 American Medical  Association. All rights reserved. ?The codes documented in this report are preliminary and upon coder review may  ?be revised to meet current compliance requirements. ?Aviva Signs, MD ?Aviva Signs, MD ?01/30/2022 7:43:17 AM ?This report has been signed electronically. ?Number of Addenda: 0 ?

## 2022-01-30 NOTE — Anesthesia Postprocedure Evaluation (Signed)
Anesthesia Post Note ? ?Patient: Rita Smith ? ?Procedure(s) Performed: COLONOSCOPY WITH PROPOFOL ? ?Patient location during evaluation: Endoscopy ?Anesthesia Type: General ?Level of consciousness: awake and alert ?Pain management: pain level controlled ?Vital Signs Assessment: post-procedure vital signs reviewed and stable ?Respiratory status: spontaneous breathing ?Cardiovascular status: blood pressure returned to baseline and stable ?Postop Assessment: no apparent nausea or vomiting ?Anesthetic complications: no ? ? ?No notable events documented. ? ? ?Last Vitals:  ?Vitals:  ? 01/30/22 0655  ?BP: 120/67  ?Resp: 16  ?Temp: 36.5 ?C  ?SpO2: 97%  ?  ?Last Pain:  ?Vitals:  ? 01/30/22 0728  ?TempSrc:   ?PainSc: 0-No pain  ? ? ?  ?  ?  ?  ?  ?  ? ?Rita Smith ? ? ? ? ?

## 2022-01-30 NOTE — Transfer of Care (Signed)
Immediate Anesthesia Transfer of Care Note ? ?Patient: Rita Smith ? ?Procedure(s) Performed: COLONOSCOPY WITH PROPOFOL ? ?Patient Location: Endoscopy Unit ? ?Anesthesia Type:General ? ?Level of Consciousness: awake ? ?Airway & Oxygen Therapy: Patient Spontanous Breathing ? ?Post-op Assessment: Report given to RN ? ?Post vital signs: Reviewed and stable ? ?Last Vitals:  ?Vitals Value Taken Time  ?BP    ?Temp    ?Pulse    ?Resp    ?SpO2    ? ? ?Last Pain:  ?Vitals:  ? 01/30/22 0728  ?TempSrc:   ?PainSc: 0-No pain  ?   ? ?Patients Stated Pain Goal: 7 (01/30/22 [id number]) ? ?Complications: No notable events documented. ?

## 2022-01-30 NOTE — Anesthesia Preprocedure Evaluation (Addendum)
Anesthesia Evaluation  ?Patient identified by MRN, date of birth, ID band ?Patient awake ? ? ? ?Reviewed: ?Allergy & Precautions, NPO status , Patient's Chart, lab work & pertinent test results ? ?Airway ?Mallampati: II ? ?TM Distance: >3 FB ?Neck ROM: Full ? ? ? Dental ? ?(+) Dental Advisory Given, Teeth Intact ?  ?Pulmonary ? ?Snoring, ?Occasional wheezing  ?  ?Pulmonary exam normal ?breath sounds clear to auscultation ? ? ? ? ? ? Cardiovascular ?negative cardio ROS ?Normal cardiovascular exam ?Rhythm:Regular Rate:Normal ? ? ?  ?Neuro/Psych ? Headaches, negative psych ROS  ? GI/Hepatic ?Neg liver ROS, PUD, Bowel prep,GERD (mild)  Controlled,Colitis  ?  ?Endo/Other  ?negative endocrine ROS ? Renal/GU ?negative Renal ROS  ?negative genitourinary ?  ?Musculoskeletal ?negative musculoskeletal ROS ?(+)  ? Abdominal ?  ?Peds ?negative pediatric ROS ?(+)  Hematology ?negative hematology ROS ?(+)   ?Anesthesia Other Findings ?Snoring, ?Occasional wheezing  ? Reproductive/Obstetrics ?negative OB ROS ? ?  ? ? ? ? ? ? ? ? ? ? ? ? ? ?  ?  ? ? ? ? ? ? ? ?Anesthesia Physical ?Anesthesia Plan ? ?ASA: 2 ? ?Anesthesia Plan: General  ? ?Post-op Pain Management: Minimal or no pain anticipated  ? ?Induction: Intravenous ? ?PONV Risk Score and Plan: Propofol infusion ? ?Airway Management Planned: Nasal Cannula and Natural Airway ? ?Additional Equipment:  ? ?Intra-op Plan:  ? ?Post-operative Plan:  ? ?Informed Consent: I have reviewed the patients History and Physical, chart, labs and discussed the procedure including the risks, benefits and alternatives for the proposed anesthesia with the patient or authorized representative who has indicated his/her understanding and acceptance.  ? ? ? ?Dental advisory given ? ?Plan Discussed with: CRNA and Surgeon ? ?Anesthesia Plan Comments:   ? ? ? ? ? ? ?Anesthesia Quick Evaluation ? ?

## 2022-02-02 ENCOUNTER — Encounter (HOSPITAL_COMMUNITY): Payer: Self-pay | Admitting: General Surgery

## 2022-05-14 ENCOUNTER — Ambulatory Visit: Payer: BC Managed Care – PPO | Admitting: Adult Health

## 2022-05-16 ENCOUNTER — Other Ambulatory Visit (HOSPITAL_COMMUNITY)
Admission: RE | Admit: 2022-05-16 | Discharge: 2022-05-16 | Disposition: A | Discharge status: Home / Self Care | Payer: BC Managed Care – PPO | Source: Ambulatory Visit | Attending: Adult Health | Admitting: Adult Health

## 2022-05-16 ENCOUNTER — Encounter: Payer: Self-pay | Admitting: Adult Health

## 2022-05-16 ENCOUNTER — Ambulatory Visit: Payer: BC Managed Care – PPO | Admitting: Adult Health

## 2022-05-16 VITALS — BP 132/91 | HR 89 | Ht 66.5 in | Wt 219.5 lb

## 2022-05-16 DIAGNOSIS — Z124 Encounter for screening for malignant neoplasm of cervix: Secondary | ICD-10-CM | POA: Diagnosis present

## 2022-05-16 DIAGNOSIS — Z6834 Body mass index (BMI) 34.0-34.9, adult: Secondary | ICD-10-CM

## 2022-05-16 DIAGNOSIS — N950 Postmenopausal bleeding: Secondary | ICD-10-CM

## 2022-05-16 DIAGNOSIS — Z1231 Encounter for screening mammogram for malignant neoplasm of breast: Secondary | ICD-10-CM

## 2022-05-16 DIAGNOSIS — R4589 Other symptoms and signs involving emotional state: Secondary | ICD-10-CM

## 2022-05-16 DIAGNOSIS — N951 Menopausal and female climacteric states: Secondary | ICD-10-CM | POA: Diagnosis not present

## 2022-05-16 NOTE — Progress Notes (Addendum)
  Subjective:     Patient ID: Rita Smith, female   DOB: 1965/11/25, 56 y.o.   MRN: [id number]  HPI Rita Smith is a 56 year old white female, married, PM in complaining of having pink spotting Monday, moody, wants help losing weight.  PCP is Lilyan Punt MD  Review of Systems Had pink spotting Monday +moody +wants to lose weight  Reviewed past medical,surgical, social and family history. Reviewed medications and allergies.     Objective:   Physical Exam BP (!) 132/91 (BP Location: Right Arm, Patient Position: Sitting, Cuff Size: Normal)   Pulse 89   Ht 5' 6.5" (1.689 m)   Wt 219 lb 8 oz (99.6 kg)   LMP 02/13/2021 (Approximate) Comment: spotting  BMI 34.90 kg/m  Skin warm and dry.Pelvic: external genitalia is normal in appearance no lesions, vagina: pale pink,urethra has no lesions or masses noted, cervix:smooth, pap with HR HPV genotyping performed, uterus: normal size, shape and contour, non tender, no masses felt, adnexa: no masses or tenderness noted. Bladder is non tender and no masses felt.  Has nodule with hair left wrist, to see dermalogy Examination chaperoned by Malachy Mood LPN   [facility] - 05/16/22 1112       Pregnancy Intention Screening   Does the patient want to become pregnant in the next year? No    Does the patient's partner want to become pregnant in the next year? No    Would the patient like to discuss contraceptive options today? No      Contraception Wrap Up   Current Method Female Condom   postmenopausal   End Method Female Condom   postmenopausal                Assessment:     1. Routine cervical smear Pap sent Pap in 3 years if normal  - Cytology - PAP( [facility])  2. PMB (postmenopausal bleeding) Spotted pink on Monday Will get [place] 06/01/22 at 1:30 pm at [facility] to assess uterine lining - US PELVIC COMPLETE WITH TRANSVAGINAL; Future  3. Moody Will wait to see if better after vacation, did discuss meds  4. Menopausal symptoms Not really  having hot flashes, moody   5. Body mass index 34.0-34.9, adult Referred to [facility] for weight loss consult  6. Screening mammogram for breast cancer Mammogram scheduled for 06/01/22 at 1:15 pm at [facility] - MM 3D SCREEN BREAST BILATERAL; Future     Plan:      Will talk when results back  Follow up TBD

## 2022-05-23 LAB — CYTOLOGY - PAP
Comment: NEGATIVE
Diagnosis: NEGATIVE
High risk HPV: NEGATIVE

## 2022-06-01 ENCOUNTER — Ambulatory Visit (HOSPITAL_COMMUNITY): Payer: BC Managed Care – PPO

## 2022-06-01 ENCOUNTER — Other Ambulatory Visit (HOSPITAL_COMMUNITY): Payer: BC Managed Care – PPO

## 2022-06-08 ENCOUNTER — Ambulatory Visit (HOSPITAL_COMMUNITY)
Admission: RE | Admit: 2022-06-08 | Discharge: 2022-06-08 | Disposition: A | Discharge status: Home / Self Care | Payer: BC Managed Care – PPO | Source: Ambulatory Visit | Attending: Adult Health | Admitting: Adult Health

## 2022-06-08 DIAGNOSIS — Z1231 Encounter for screening mammogram for malignant neoplasm of breast: Secondary | ICD-10-CM | POA: Diagnosis present

## 2022-07-23 ENCOUNTER — Ambulatory Visit: Payer: BC Managed Care – PPO | Admitting: Family Medicine

## 2022-07-23 ENCOUNTER — Encounter: Payer: Self-pay | Admitting: Family Medicine

## 2022-07-23 VITALS — Ht 66.5 in

## 2022-07-23 DIAGNOSIS — L03032 Cellulitis of left toe: Secondary | ICD-10-CM

## 2022-07-23 DIAGNOSIS — J029 Acute pharyngitis, unspecified: Secondary | ICD-10-CM | POA: Diagnosis not present

## 2022-07-23 DIAGNOSIS — L600 Ingrowing nail: Secondary | ICD-10-CM | POA: Diagnosis not present

## 2022-07-23 DIAGNOSIS — B349 Viral infection, unspecified: Secondary | ICD-10-CM

## 2022-07-23 MED ORDER — CEPHALEXIN 500 MG PO CAPS: 500.0000 mg | ORAL_CAPSULE | Freq: Three times a day (TID) | ORAL | 0 refills | Status: DC

## 2022-07-23 NOTE — Progress Notes (Signed)
   Subjective:    Patient ID: Rita Smith, female    DOB: [birth year], 56 y.o.   MRN: [id number]  HPI Raspy throat, congestion w/ slight cough, nasal drip, weakness, chilly today  Headache Jassen drainage sore throat runny nose slight chills no high fever wheezing or difficulty breathing Review of Systems     Objective:   Physical Exam  Gen-NAD not toxic TMS-normal bilateral T- normal no redness Chest-CTA respiratory rate normal no crackles CV RRR no murmur Skin-warm dry Neuro-grossly normal  In the toenail with cellulitis and large toenail needs to be seen by specialist     Assessment & Plan:  viral syndrome Possible COVID Strep test not indicated X-rays not indicated In the distance this has ingrown toenail with large toenail needs clipping or removal by podiatry along with antibiotics today

## 2022-07-24 LAB — NOVEL CORONAVIRUS, NAA: SARS-CoV-2, NAA: NOT DETECTED

## 2022-07-31 ENCOUNTER — Telehealth: Payer: Self-pay

## 2022-07-31 NOTE — Telephone Encounter (Signed)
Nurses-certainly after a virus cough can last sometimes for weeks but it is important to make sure that she is not experiencing other problems. Please make sure that if she is having fevers, shortness of breath, wheezing, progressive symptoms she needs to be seen.  If she feels this is gradually improving then may use OTC Delsym or can take Tessalon Perles 100 mg, 1 taken every 8 hours as needed for cough, #18, 1 refill Obviously if her cough does not go away over the course of the next 3 to 4 weeks it would be important for her to do a follow-up visit

## 2022-07-31 NOTE — Telephone Encounter (Signed)
Caller name:Shandrell Laurance Flatten   On DPR? :Yes  Call back [phone]  Provider they see: Luking   Reason for call:Pt has a cough where she was sick last week and wants to see if she can get a cough med sent in to [facility] on [address]

## 2022-08-02 ENCOUNTER — Other Ambulatory Visit: Payer: Self-pay

## 2022-08-02 MED ORDER — BENZONATATE 100 MG PO CAPS: ORAL_CAPSULE | ORAL | 1 refills | Status: DC

## 2022-08-02 NOTE — Telephone Encounter (Signed)
Patient made aware.

## 2022-08-11 ENCOUNTER — Ambulatory Visit (HOSPITAL_COMMUNITY)
Admission: EM | Admit: 2022-08-11 | Discharge: 2022-08-11 | Disposition: A | Discharge status: Home / Self Care | Payer: BC Managed Care – PPO | Attending: Emergency Medicine | Admitting: Emergency Medicine

## 2022-08-11 ENCOUNTER — Encounter (HOSPITAL_COMMUNITY): Payer: Self-pay

## 2022-08-11 DIAGNOSIS — Z973 Presence of spectacles and contact lenses: Secondary | ICD-10-CM

## 2022-08-11 DIAGNOSIS — H5789 Other specified disorders of eye and adnexa: Secondary | ICD-10-CM | POA: Diagnosis not present

## 2022-08-11 MED ORDER — CIPROFLOXACIN HCL 0.3 % OP SOLN: 1.0000 [drp] | OPHTHALMIC | 0 refills | Status: DC

## 2022-08-11 MED ORDER — EYE WASH OP SOLN
OPHTHALMIC | Status: AC
  Filled 2022-08-11: qty 118

## 2022-08-11 NOTE — ED Provider Notes (Signed)
[facility]    CSN: [id number] Arrival date & time: 08/11/22  1725      History   Chief Complaint Chief Complaint  Patient presents with   Eye Pain    HPI Rita Smith is a 56 y.o. female.  Presents with right eye irritation Was putting her contact in today, rubbed her eye, and felt pain in the eye Could not find her contact, didn't know if it was somewhere in her eye or fell out  Lots of irritation and redness of right eye Denies vision changes apart from watering due to irritation  Past Medical History:  Diagnosis Date   Colitis    Headache(784.0)    Rosacea    Urinary frequency 06/15/2015   Uveitis    Varicose vein of leg 05/17/2014   Vitamin D deficiency     Patient Active Problem List   Diagnosis Date Noted   Routine cervical smear 05/16/2022   Body mass index 34.0-34.9, adult 05/16/2022   Screening mammogram for breast cancer 05/16/2022   PMB (postmenopausal bleeding) 02/28/2021   Wheezing on expiration 09/29/2020   Viral upper respiratory tract infection 09/29/2020   Hot flashes 05/20/2020   Fatigue 05/20/2020   Body aches 05/20/2020   Irregular periods 05/20/2020   Moody 05/20/2020   Night sweats 05/20/2020   Special screening for malignant neoplasms, colon 06/24/2018   Menopausal symptoms 06/24/2018   Well woman exam with routine gynecological exam 06/20/2017   Urinary frequency 06/15/2015   Varicose vein of leg 05/17/2014   Uveitis 05/12/2013   Menorrhagia 05/11/2013   ULCERATIVE PROCTOSIGMOIDITIS 03/25/2009   COLITIS 02/02/2009   MIGRAINES, HX OF 02/02/2009    Past Surgical History:  Procedure Laterality Date   CESAREAN SECTION     COLONOSCOPY     COLONOSCOPY WITH PROPOFOL N/A 01/30/2022   Procedure: COLONOSCOPY WITH PROPOFOL;  Surgeon: Franky Macho, MD;  Location: [facility];  Service: Gastroenterology;  Laterality: N/A;    OB History     Gravida  2   Para  2   Term      Preterm      AB      Living  2       SAB      IAB      Ectopic      Multiple      Live Births  2            Home Medications    Prior to Admission medications   Medication Sig Start Date End Date Taking? Authorizing Provider  benzonatate (TESSALON) 100 MG capsule 1 taken every 8 hours as needed for cough 08/02/22   Luking, Jonna Coup, MD  ciprofloxacin (CILOXAN) 0.3 % ophthalmic solution Place 1 drop into the right eye every 2 (two) hours. Administer 1 drop, every 2 hours while awake for 2 days. Then 1 drop, every 4 hours, while awake, for the next 5 days. 08/11/22  Yes Morell Mears, Lurena Joiner, PA-C  albuterol (VENTOLIN HFA) 108 (90 Base) MCG/ACT inhaler Inhale 1-2 puffs into the lungs every 6 (six) hours as needed for wheezing or shortness of breath.    [provider]  BIOTIN PO Take by mouth.    [provider]  cephALEXin (KEFLEX) 500 MG capsule Take 1 capsule (500 mg total) by mouth 3 (three) times daily. 07/23/22   Babs Sciara, MD  ibuprofen (ADVIL) 200 MG tablet Take 200 mg by mouth every 8 (eight) hours as needed for moderate pain.  [provider]  loratadine (CLARITIN REDITABS) 10 MG dissolvable tablet Take 10 mg by mouth.    [provider]  Multiple Vitamins-Minerals (MULTIVITAMIN WITH MINERALS) tablet Take 1 tablet by mouth daily.    [provider]    Family History Family History  Problem Relation Age of Onset   Coronary artery disease Paternal Grandmother    Cancer Maternal Grandfather        throat   Coronary artery disease Father    Cancer Paternal Uncle        colon   Cancer Maternal Uncle        lung   Glaucoma Mother    Cataracts Mother    Asthma Son    Autism Son     Social History Social History   Tobacco Use   Smoking status: Never   Smokeless tobacco: Never  Vaping Use   Vaping Use: Never used  Substance Use Topics   Alcohol use: Yes    Comment: socially    Drug use: No     Allergies   Patient has no known  allergies.   Review of Systems Review of Systems  Eyes:  Positive for pain.   Per HPI  Physical Exam Triage Vital Signs ED Triage Vitals [08/11/22 1838]  Enc Vitals Group     BP (!) 162/93     Pulse Rate 86     Resp 16     Temp 98.6 F (37 C)     Temp Source Oral     SpO2 99 %     Weight      Height      Head Circumference      Peak Flow      Pain Score      Pain Loc      Pain Edu?      Excl. in GC?    No data found.  Updated Vital Signs BP (!) 162/93 (BP Location: Left Arm)   Pulse 86   Temp 98.6 F (37 C) (Oral)   Resp 16   LMP 02/13/2021 (Approximate) Comment: spotting  SpO2 99%    Physical Exam Constitutional:      General: She is not in acute distress.    Appearance: Normal appearance.  Eyes:     General: Lids are normal. Vision grossly intact.     Extraocular Movements: Extraocular movements intact.     Conjunctiva/sclera:     Right eye: Right conjunctiva is injected.     Pupils: Pupils are equal, round, and reactive to light.     Comments: Eyelash noted in eye - removed with swab. No contact noted. No discharge. No uptake on fluorescein exam.  Cardiovascular:     Rate and Rhythm: Normal rate and regular rhythm.     Pulses: Normal pulses.  Pulmonary:     Effort: Pulmonary effort is normal.  Neurological:     Mental Status: She is alert and oriented to person, place, and time.     UC Treatments / Results  Labs (all labs ordered are listed, but only abnormal results are displayed) Labs Reviewed - No data to display  EKG   Radiology No results found.  Procedures Procedures (including critical care time)  Medications Ordered in UC Medications - No data to display  Initial Impression / Assessment and Plan / UC Course  I have reviewed the triage vital signs and the nursing notes.  Pertinent labs & imaging results that were available during my care of  the patient were reviewed by me and considered in my medical decision making (see  chart for details).  Eyelash removed, could have been contributing to irritation.  No contact noted in the eye. Likely fell out. No ulcer or abrasion noted with woods lamp. Ciprofloxacin drops prescribed.  Recommend follow up with eye specialist as needed. Patient agrees to plan  Final Clinical Impressions(s) / UC Diagnoses   Final diagnoses:  Irritation of right eye  Wears contact lenses     Discharge Instructions      Use eye drops as prescribed (see next page)  Follow up with eye specialist next week if needed.    ED Prescriptions     Medication Sig Dispense Auth. Provider   ciprofloxacin (CILOXAN) 0.3 % ophthalmic solution Place 1 drop into the right eye every 2 (two) hours. Administer 1 drop, every 2 hours while awake for 2 days. Then 1 drop, every 4 hours, while awake, for the next 5 days. 5 mL Marjorie Lussier, Wells Guiles, PA-C      PDMP not reviewed this encounter.   Kyra Leyland 08/11/22 1905

## 2022-08-11 NOTE — ED Triage Notes (Signed)
Pt presents to the office for right eye pain. Pt reported she has a contact lens in her right eye.

## 2022-08-11 NOTE — Discharge Instructions (Addendum)
Use eye drops as prescribed (see next page)  Follow up with eye specialist next week if needed.

## 2022-08-23 ENCOUNTER — Ambulatory Visit: Payer: BC Managed Care – PPO

## 2022-08-24 ENCOUNTER — Ambulatory Visit: Payer: BC Managed Care – PPO | Admitting: Family Medicine

## 2022-08-24 VITALS — BP 126/81 | HR 90 | Temp 98.1°F | Ht 66.5 in | Wt 215.0 lb

## 2022-08-24 DIAGNOSIS — J069 Acute upper respiratory infection, unspecified: Secondary | ICD-10-CM | POA: Diagnosis not present

## 2022-08-24 DIAGNOSIS — J040 Acute laryngitis: Secondary | ICD-10-CM

## 2022-08-24 NOTE — Progress Notes (Signed)
   Subjective:    Patient ID: Rita Smith, female    DOB: June 26, 1966, 56 y.o.   MRN: [id number]  HPI Sore throat, laryngitis low grade fever, nasal drainage, diarrhea yesterday all since wednesday Denies high fever chills sweats relates mild laryngitis  Review of Systems     Objective:   Physical Exam  Gen-NAD not toxic TMS-normal bilateral T- normal no redness Chest-CTA respiratory rate normal no crackles CV RRR no murmur Skin-warm dry Neuro-grossly normal       Assessment & Plan:   Viral syndrome COVID swab Await results Rest up Supportive measures discussed

## 2022-08-26 LAB — NOVEL CORONAVIRUS, NAA: SARS-CoV-2, NAA: NOT DETECTED

## 2022-08-28 ENCOUNTER — Ambulatory Visit: Payer: BC Managed Care – PPO | Admitting: Nurse Practitioner

## 2022-08-28 VITALS — BP 128/86 | Temp 98.0°F | Ht 66.5 in | Wt 215.0 lb

## 2022-08-28 DIAGNOSIS — J329 Chronic sinusitis, unspecified: Secondary | ICD-10-CM

## 2022-08-28 MED ORDER — ALBUTEROL SULFATE HFA 108 (90 BASE) MCG/ACT IN AERS: 1.0000 | INHALATION_SPRAY | Freq: Four times a day (QID) | RESPIRATORY_TRACT | 1 refills | Status: AC | PRN

## 2022-08-28 MED ORDER — AMOXICILLIN 875 MG PO TABS: 875.0000 mg | ORAL_TABLET | Freq: Two times a day (BID) | ORAL | 0 refills | Status: AC

## 2022-08-28 NOTE — Progress Notes (Unsigned)
   Subjective:    Patient ID: Rita Smith, female    DOB: 09/08/1966, 57 y.o.   MRN: [id number]  Cough This is a new problem. The current episode started 1 to 4 weeks ago.   Cough and hoarseness continues- seen 08/24/22   Review of Systems  Respiratory:  Positive for cough.        Objective:   Physical Exam        Assessment & Plan:

## 2022-08-30 ENCOUNTER — Encounter: Payer: Self-pay | Admitting: Nurse Practitioner

## 2022-08-31 ENCOUNTER — Ambulatory Visit: Payer: BC Managed Care – PPO

## 2022-09-02 LAB — COVID-19, FLU A+B AND RSV
Influenza A, NAA: NOT DETECTED
Influenza B, NAA: NOT DETECTED
RSV, NAA: NOT DETECTED
SARS-CoV-2, NAA: NOT DETECTED

## 2022-09-06 ENCOUNTER — Ambulatory Visit: Payer: BC Managed Care – PPO | Admitting: Family Medicine

## 2022-09-06 ENCOUNTER — Encounter: Payer: Self-pay | Admitting: Family Medicine

## 2022-09-06 VITALS — BP 122/77 | HR 81 | Temp 97.8°F | Wt 214.4 lb

## 2022-09-06 DIAGNOSIS — J988 Other specified respiratory disorders: Secondary | ICD-10-CM

## 2022-09-06 DIAGNOSIS — B9789 Other viral agents as the cause of diseases classified elsewhere: Secondary | ICD-10-CM

## 2022-09-06 MED ORDER — BENZONATATE 200 MG PO CAPS: 200.0000 mg | ORAL_CAPSULE | Freq: Three times a day (TID) | ORAL | 0 refills | Status: DC | PRN

## 2022-09-06 MED ORDER — PREDNISONE 50 MG PO TABS: ORAL_TABLET | ORAL | 0 refills | Status: DC

## 2022-09-06 NOTE — Assessment & Plan Note (Signed)
I feel that this is viral in origin.  She has not improved with amoxicillin.  She also has laryngitis which is classically viral.  Treating with prednisone and Tessalon Perles.  Supportive care.  Advised voice rest.

## 2022-09-06 NOTE — Progress Notes (Signed)
Subjective:  Patient ID: Rita Smith, female    DOB: Jul 01, 1966  Age: 56 y.o. MRN: [id number]  CC: Chief Complaint  Patient presents with   Cough    Pt arrives with productive cough, some wheezing and nasal drainage. Pt was seen on 08/28/22 for runny nose. Pt is currently on Amoxicillin     HPI:  56 year old female presents for evaluation of the above.  Patient recently seen on 11/3 and 11/7.  Initially was treated for a viral respiratory infection.  At visit on 11/7 was treated for rhinosinusitis with amoxicillin.  Patient reports that she continues to be symptomatic.  She reports productive cough.  Some wheezing.  Is also having drainage.  Has not yet finished amoxicillin.  Also is having ongoing hoarseness.  She has had sick contacts.  No fever.  Patient Active Problem List   Diagnosis Date Noted   Body mass index 34.0-34.9, adult 05/16/2022   PMB (postmenopausal bleeding) 02/28/2021   Viral respiratory infection 09/29/2020   Menopausal symptoms 06/24/2018   Varicose vein of leg 05/17/2014   Uveitis 05/12/2013   Menorrhagia 05/11/2013   MIGRAINES, HX OF 02/02/2009    Social Hx   Social History   Socioeconomic History   Marital status: Married    Spouse name: Not on file   Number of children: 2   Years of education: Not on file   Highest education level: Not on file  Occupational History   Not on file  Tobacco Use   Smoking status: Never   Smokeless tobacco: Never  Vaping Use   Vaping Use: Never used  Substance and Sexual Activity   Alcohol use: Yes    Comment: socially    Drug use: No   Sexual activity: Yes    Birth control/protection: Condom, Post-menopausal  Other Topics Concern   Not on file  Social History Narrative   Not on file   Social Determinants of Health   Financial Resource Strain: Not on file  Food Insecurity: Not on file  Transportation Needs: Not on file  Physical Activity: Not on file  Stress: Not on file  Social Connections: Not  on file    Review of Systems Per HPI  Objective:  BP 122/77   Pulse 81   Temp 97.8 F (36.6 C)   Wt 214 lb 6.4 oz (97.3 kg)   LMP 02/13/2021 (Approximate) Comment: spotting  SpO2 97%   BMI 34.09 kg/m      09/06/2022    9:53 AM 08/28/2022    3:05 PM 08/24/2022   10:34 AM  BP/Weight  Systolic BP 122 128 126  Diastolic BP 77 86 81  Wt. (Lbs) 214.4 215 215  BMI 34.09 kg/m2 34.18 kg/m2 34.18 kg/m2    Physical Exam Constitutional:      General: She is not in acute distress.    Appearance: Normal appearance.  HENT:     Head: Normocephalic and atraumatic.     Mouth/Throat:     Pharynx: No oropharyngeal exudate.  Eyes:     General:        Right eye: No discharge.        Left eye: No discharge.     Conjunctiva/sclera: Conjunctivae normal.  Cardiovascular:     Rate and Rhythm: Normal rate and regular rhythm.  Pulmonary:     Effort: Pulmonary effort is normal.     Breath sounds: Normal breath sounds. No wheezing, rhonchi or rales.  Neurological:     Mental Status: She  is alert.     Lab Results  Component Value Date   WBC 6.2 05/20/2020   HGB 15.8 05/20/2020   HCT 47.8 (H) 05/20/2020   PLT 300 05/20/2020   GLUCOSE 78 05/20/2020   CHOL 212 (H) 10/26/2019   TRIG 128 10/26/2019   HDL 69 10/26/2019   LDLCALC 121 (H) 10/26/2019   ALT 43 (H) 05/20/2020   AST 27 05/20/2020   NA 141 05/20/2020   K 4.4 05/20/2020   CL 102 05/20/2020   CREATININE 0.88 05/20/2020   BUN 13 05/20/2020   CO2 26 05/20/2020   TSH 2.220 05/20/2020   HGBA1C 5.4 10/26/2019     Assessment & Plan:   Problem List Items Addressed This Visit       Respiratory   Viral respiratory infection - Primary    I feel that this is viral in origin.  She has not improved with amoxicillin.  She also has laryngitis which is classically viral.  Treating with prednisone and Tessalon Perles.  Supportive care.  Advised voice rest.       Meds ordered this encounter  Medications   predniSONE  (DELTASONE) 50 MG tablet    Sig: 1 tablet daily x 5 days    Dispense:  5 tablet    Refill:  0   benzonatate (TESSALON) 200 MG capsule    Sig: Take 1 capsule (200 mg total) by mouth 3 (three) times daily as needed for cough.    Dispense:  30 capsule    Refill:  0    Follow-up:  Return if symptoms worsen or fail to improve.  Rita Smith Other DO [facility]

## 2022-09-06 NOTE — Patient Instructions (Signed)
Medication as prescribed.  Voice rest.  Lots of fluids.  Take care  Dr. Adriana Simas

## 2022-09-10 ENCOUNTER — Encounter: Payer: Self-pay | Admitting: *Deleted

## 2022-09-12 ENCOUNTER — Ambulatory Visit: Payer: BC Managed Care – PPO

## 2022-09-21 ENCOUNTER — Ambulatory Visit: Payer: BC Managed Care – PPO | Admitting: Family Medicine

## 2022-09-21 ENCOUNTER — Ambulatory Visit: Payer: BC Managed Care – PPO | Admitting: Nurse Practitioner

## 2022-09-26 ENCOUNTER — Ambulatory Visit: Payer: BC Managed Care – PPO | Admitting: Family Medicine

## 2022-09-26 ENCOUNTER — Encounter: Payer: Self-pay | Admitting: Family Medicine

## 2022-09-26 VITALS — BP 130/78 | HR 90 | Temp 98.1°F

## 2022-09-26 DIAGNOSIS — B351 Tinea unguium: Secondary | ICD-10-CM | POA: Diagnosis not present

## 2022-09-26 NOTE — Progress Notes (Signed)
   Subjective:    Patient ID: Rita Smith, female    DOB: 14-Mar-1966, 56 y.o.   MRN: [id number]  HPI Patient arrives today to follow up with cellulitis left toe.   Patient states she is having issue with right toe big toe nail.  Patient would like to discuss blood pressure.      Review of Systems     Objective:   Physical Exam Lungs are clear blood pressure very good toenail on the right side exceptionally thick and long with curving       Assessment & Plan:  She needs to have the toenail removed on the right great toe this is going to cause her significant trouble if not taking care of. 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  And they had her care me heart rate went up she is no longer on phenteramine she is trying to eat healthy blood pressure is good today

## 2022-10-01 ENCOUNTER — Other Ambulatory Visit: Payer: Self-pay | Admitting: *Deleted

## 2022-10-01 DIAGNOSIS — B351 Tinea unguium: Secondary | ICD-10-CM

## 2022-10-01 DIAGNOSIS — L600 Ingrowing nail: Secondary | ICD-10-CM

## 2022-10-02 ENCOUNTER — Ambulatory Visit (INDEPENDENT_AMBULATORY_CARE_PROVIDER_SITE_OTHER): Payer: BC Managed Care – PPO | Admitting: *Deleted

## 2022-10-02 VITALS — BP 122/70

## 2022-10-02 DIAGNOSIS — R030 Elevated blood-pressure reading, without diagnosis of hypertension: Secondary | ICD-10-CM | POA: Diagnosis not present

## 2022-10-02 NOTE — Progress Notes (Signed)
   Subjective:    Patient ID: Rita Smith, female    DOB: 1966-01-02, 56 y.o.   MRN: [id number]  HPI  Patient arrives for a blood pressure check  BP 122/70- consult with Dr Lorin Picket. Blood Pressure is running great follow up for regular check up in January. Patient notified and verbalized understanding.  Review of Systems     Objective:   Physical Exam        Assessment & Plan:

## 2022-10-12 ENCOUNTER — Other Ambulatory Visit (HOSPITAL_COMMUNITY): Payer: Self-pay

## 2022-10-12 MED ORDER — WEGOVY 0.5 MG/0.5ML ~~LOC~~ SOAJ: 0.5000 mg | SUBCUTANEOUS | 0 refills | Status: DC

## 2022-10-12 MED ORDER — WEGOVY 1 MG/0.5ML ~~LOC~~ SOAJ: 1.0000 mg | SUBCUTANEOUS | 0 refills | Status: DC

## 2022-10-12 MED ORDER — WEGOVY 0.25 MG/0.5ML ~~LOC~~ SOAJ
0.2500 mg | SUBCUTANEOUS | 0 refills | Status: DC
  Filled 2022-10-12 – 2022-11-05 (×2): qty 2, 28d supply, fill #0

## 2022-10-24 ENCOUNTER — Other Ambulatory Visit (HOSPITAL_COMMUNITY): Payer: Self-pay

## 2022-11-05 ENCOUNTER — Other Ambulatory Visit (HOSPITAL_COMMUNITY): Payer: Self-pay

## 2022-11-07 ENCOUNTER — Other Ambulatory Visit: Payer: Self-pay

## 2022-11-16 ENCOUNTER — Other Ambulatory Visit (HOSPITAL_COMMUNITY): Payer: Self-pay

## 2022-12-03 ENCOUNTER — Ambulatory Visit: Payer: BC Managed Care – PPO | Admitting: Family Medicine

## 2022-12-05 ENCOUNTER — Encounter: Payer: Self-pay | Admitting: Family Medicine

## 2022-12-05 ENCOUNTER — Ambulatory Visit (INDEPENDENT_AMBULATORY_CARE_PROVIDER_SITE_OTHER): Payer: BC Managed Care – PPO | Admitting: Family Medicine

## 2022-12-05 VITALS — BP 135/85 | HR 102 | Temp 97.5°F | Wt 213.2 lb

## 2022-12-05 DIAGNOSIS — J329 Chronic sinusitis, unspecified: Secondary | ICD-10-CM | POA: Diagnosis not present

## 2022-12-05 MED ORDER — AMOXICILLIN 500 MG PO CAPS: ORAL_CAPSULE | ORAL | 0 refills | Status: DC

## 2022-12-05 NOTE — Progress Notes (Signed)
   Subjective:    Patient ID: Rita Smith, female    DOB: [birth year], 57 y.o.   MRN: [id number]  HPI Pt arrives with cough, congestion, runny nose and some weakness. Cough has been lingering since Sunday. No issues with breathing.   Denies high fever chills or sweats.  No wheezing or difficulty breathing.  Some sinus pressure and pain.  She does relate she is going on a trip to see family leaving on a plane later today Review of Systems     Objective:   Physical Exam Gen-NAD not toxic TMS-normal bilateral T- normal no redness Chest-CTA respiratory rate normal no crackles CV RRR no murmur Skin-warm dry Neuro-grossly normal   We have a rapid COVID test here, COVID swab would take 36 to 48 hours to come back which would not suit well with her travel plans therefore she was told to do a home COVID test     Assessment & Plan:  Viral syndrome Acute rhinosinusitis I suspect this is mainly virus prescription for amoxicillin was given in case her symptoms get worse while on her trip She was encouraged to do a home COVID test if positive would recommend not going on trip Follow-up if any progressive symptoms

## 2022-12-24 ENCOUNTER — Ambulatory Visit: Payer: BC Managed Care – PPO | Admitting: Family Medicine

## 2022-12-24 VITALS — BP 133/84 | Temp 98.9°F | Ht 66.0 in | Wt 213.0 lb

## 2022-12-24 DIAGNOSIS — B9789 Other viral agents as the cause of diseases classified elsewhere: Secondary | ICD-10-CM | POA: Diagnosis not present

## 2022-12-24 DIAGNOSIS — R6889 Other general symptoms and signs: Secondary | ICD-10-CM | POA: Diagnosis not present

## 2022-12-24 DIAGNOSIS — J988 Other specified respiratory disorders: Secondary | ICD-10-CM | POA: Diagnosis not present

## 2022-12-24 NOTE — Progress Notes (Signed)
   Subjective:    Patient ID: Rita Smith, female    DOB: [birth year], 56 y.o.   MRN: [id number]  Sinus Problem This is a new problem. The current episode started in the past 7 days. Associated symptoms include congestion and ear pain. (Chills, body aches , fatigue)   Viral-like illness with head congestion body aches headache not feeling good denies high fever chills   Review of Systems  HENT:  Positive for congestion and ear pain.        Objective:   Physical Exam  Gen-NAD not toxic TMS-normal bilateral T- normal no redness Chest-CTA respiratory rate normal no crackles CV RRR no murmur Skin-warm dry Neuro-grossly normal       Assessment & Plan:  Viral-like illness Supportive measures discussed Follow-up if progressive troubles Antibiotics not indicated

## 2022-12-24 NOTE — Patient Instructions (Signed)

## 2022-12-26 LAB — COVID-19, FLU A+B AND RSV
Influenza A, NAA: NOT DETECTED
Influenza B, NAA: NOT DETECTED
RSV, NAA: NOT DETECTED
SARS-CoV-2, NAA: NOT DETECTED

## 2023-03-14 ENCOUNTER — Ambulatory Visit: Payer: BC Managed Care – PPO | Admitting: Nurse Practitioner

## 2023-03-14 ENCOUNTER — Encounter: Payer: Self-pay | Admitting: Nurse Practitioner

## 2023-03-14 VITALS — BP 128/82 | Temp 98.1°F | Ht 66.0 in | Wt 207.8 lb

## 2023-03-14 DIAGNOSIS — B9689 Other specified bacterial agents as the cause of diseases classified elsewhere: Secondary | ICD-10-CM | POA: Diagnosis not present

## 2023-03-14 DIAGNOSIS — J209 Acute bronchitis, unspecified: Secondary | ICD-10-CM

## 2023-03-14 DIAGNOSIS — J069 Acute upper respiratory infection, unspecified: Secondary | ICD-10-CM

## 2023-03-14 MED ORDER — BUDESONIDE-FORMOTEROL FUMARATE 80-4.5 MCG/ACT IN AERO: 2.0000 | INHALATION_SPRAY | Freq: Two times a day (BID) | RESPIRATORY_TRACT | 2 refills | Status: AC

## 2023-03-14 MED ORDER — AZITHROMYCIN 250 MG PO TABS: ORAL_TABLET | ORAL | 0 refills | Status: DC

## 2023-03-14 MED ORDER — BUDESONIDE-FORMOTEROL FUMARATE 80-4.5 MCG/ACT IN AERO: 2.0000 | INHALATION_SPRAY | Freq: Two times a day (BID) | RESPIRATORY_TRACT | 2 refills | Status: DC

## 2023-03-14 NOTE — Progress Notes (Signed)
   Subjective:    Patient ID: Rita Smith, female    DOB: 05-24-1966, 57 y.o.   MRN: [id number]  HPI  Patient arrives with cough and congestion for a month. No fever.  Slight lethargy.  Irritated throat.  Nasal congestion.  Occasional sinus area headache.  Cough now producing slightly green mucus.  Slight wheezing at times, has not used her albuterol inhaler, states she cannot find it.  Denies any smoking or vaping.  Currently on Zyrtec DayQuil and elderberry.  Denies any reflux symptoms.  Review of Systems  Constitutional:  Positive for fatigue. Negative for fever.  HENT:  Positive for congestion, postnasal drip and sinus pressure. Negative for ear pain and sore throat.   Respiratory:  Positive for wheezing. Negative for cough, chest tightness and shortness of breath.   Cardiovascular:  Negative for chest pain.       Objective:   Physical Exam NAD.  Alert, oriented.  TMs retracted, no erythema.  Pharynx mildly injected with PND noted.  Neck supple with mild soft anterior adenopathy.  Harsh congested cough noted at times during the visit.  No tachypnea.  Lungs faint expiratory crackles noted anterior only, otherwise clear.  Heart regular rate rhythm. Today's Vitals   03/14/23 0910  BP: 128/82  Temp: 98.1 F (36.7 C)  TempSrc: Oral  SpO2: 97%  Weight: 207 lb 12.8 oz (94.3 kg)  Height: 5\' 6"  (1.676 m)   Body mass index is 33.54 kg/m.        Assessment & Plan:  Bacterial URI  Acute bronchitis, unspecified organism Meds ordered this encounter  Medications   azithromycin (ZITHROMAX Z-PAK) 250 MG tablet    Sig: Take 2 tablets (500 mg) on  Day 1,  followed by 1 tablet (250 mg) once daily on Days 2 through 5.    Dispense:  6 each    Refill:  0    Order Specific Question:   Supervising Provider    Answer:   Lilyan Punt A [[id number]]   budesonide-formoterol (SYMBICORT) 80-4.5 MCG/ACT inhaler    Sig: Inhale 2 puffs into the lungs 2 (two) times daily.    Dispense:  1 each     Refill:  2    Order Specific Question:   Supervising Provider    Answer:   Lilyan Punt A [id number]   Start Z-Pak as directed.  OTC meds as directed for congestion and cough. Start Symbicort 2 puffs twice daily as directed.  May use up to a total of 12 puffs in 24 hours if needed as a rescue inhaler as well. Warning signs reviewed.  Call back next week if no improvement, sooner if worse.

## 2023-07-10 DIAGNOSIS — Z9189 Other specified personal risk factors, not elsewhere classified: Secondary | ICD-10-CM | POA: Diagnosis not present

## 2023-07-10 DIAGNOSIS — G43009 Migraine without aura, not intractable, without status migrainosus: Secondary | ICD-10-CM | POA: Diagnosis not present

## 2023-07-10 DIAGNOSIS — E782 Mixed hyperlipidemia: Secondary | ICD-10-CM | POA: Diagnosis not present

## 2023-07-29 ENCOUNTER — Ambulatory Visit: Payer: BC Managed Care – PPO | Admitting: Family Medicine

## 2023-07-29 VITALS — BP 127/89 | HR 109 | Temp 97.7°F | Ht 66.0 in | Wt 210.4 lb

## 2023-07-29 DIAGNOSIS — B9789 Other viral agents as the cause of diseases classified elsewhere: Secondary | ICD-10-CM

## 2023-07-29 DIAGNOSIS — J988 Other specified respiratory disorders: Secondary | ICD-10-CM | POA: Diagnosis not present

## 2023-07-29 MED ORDER — PROMETHAZINE-DM 6.25-15 MG/5ML PO SYRP: 5.0000 mL | ORAL_SOLUTION | Freq: Four times a day (QID) | ORAL | 0 refills | Status: DC | PRN

## 2023-07-29 NOTE — Patient Instructions (Signed)
Rest. Fluids.  Medication as needed.  Test for COVID @ home and let us know.  Take care  Dr. Adriana Simas

## 2023-07-29 NOTE — Progress Notes (Signed)
Subjective:  Patient ID: [name], female    DOB: [birth year]  Age: 57 y.o. MRN: [id number]  CC: Respiratory symptoms   HPI:  57 year old female presents for evaluation of respiratory symptoms.  Patient has been sick since Saturday/Sunday.  She reports cough, runny nose, fatigue, body aches.  No fever.  No reported sick contacts.  No home testing.  She is most troubled by the fatigue.  No relieving factors.  No other complaints or concerns this time.  Patient Active Problem List   Diagnosis Date Noted   Body mass index 34.0-34.9, adult 05/16/2022   Viral respiratory infection 09/29/2020   Varicose vein of leg 05/17/2014   Uveitis 05/12/2013   Menorrhagia 05/11/2013   MIGRAINES, HX OF 02/02/2009    Social Hx   Social History   Socioeconomic History   Marital status: Married    Spouse name: Not on file   Number of children: 2   Years of education: Not on file   Highest education level: Not on file  Occupational History   Not on file  Tobacco Use   Smoking status: Never   Smokeless tobacco: Never  Vaping Use   Vaping status: Never Used  Substance and Sexual Activity   Alcohol use: Yes    Comment: socially    Drug use: No   Sexual activity: Yes    Birth control/protection: Condom, Post-menopausal  Other Topics Concern   Not on file  Social History Narrative   Not on file   Social Determinants of Health   Financial Resource Strain: Not on file  Food Insecurity: Not on file  Transportation Needs: Not on file  Physical Activity: Not on file  Stress: Not on file  Social Connections: Not on file    Review of Systems Per HPI  Objective:  BP 127/89   Pulse (!) 109   Temp 97.7 F (36.5 C)   Ht 5\' 6" (1.676 m)   Wt 210 lb 6.4 oz (95.4 kg)   LMP 02/13/2021 (Approximate) Comment: spotting  SpO2 98%   BMI 33.96 kg/m      10 /04/2023    2:40 PM 03/14/2023    9:10 AM 12/24/2022   10:35 AM  BP/Weight  Systolic BP 127 128 133  Diastolic BP 89 82 84   Wt. (Lbs) 210.4 207.8 213  BMI 33.96 kg/m2 33.54 kg/m2 34.38 kg/m2    Physical Exam Vitals and nursing note reviewed.  Constitutional:      General: She is not in acute distress.    Appearance: Normal appearance.  HENT:     Head: Normocephalic and atraumatic.     Right Ear: Tympanic membrane normal.     Left Ear: Tympanic membrane normal.     Mouth/Throat:     Pharynx: Oropharynx is clear.  Cardiovascular:     Rate and Rhythm: Regular rhythm. Tachycardia present.  Pulmonary:     Effort: Pulmonary effort is normal.     Breath sounds: No wheezing, rhonchi or rales.  Neurological:     Mental Status: She is alert.     Lab Results  Component Value Date   WBC 6.2 05/20/2020   HGB 15.8 05/20/2020   HCT 47.8 (H) 05/20/2020   PLT 300 05/20/2020   GLUCOSE 78 05/20/2020   CHOL 212 (H) 10/26/2019   TRIG 128 10/26/2019   HDL 69 10/26/2019   LDLCALC 121 (H) 10/26/2019   ALT 43 (H) 05/20/2020   AST 27 05/20/2020   NA 141 05/20/2020  K 4.4 05/20/2020   CL 102 05/20/2020   CREATININE 0.88 05/20/2020   BUN 13 05/20/2020   CO2 26 05/20/2020   TSH 2.220 05/20/2020   HGBA1C 5.4 10/26/2019     Assessment & Plan:   Problem List Items Addressed This Visit       Respiratory   Viral respiratory infection - Primary    This appears to be viral in origin.  Concern for COVID-19.  Advised to test at home as she already has a test available at home.  Promethazine DM for cough.  Supportive care.  Work note given.       Meds ordered this encounter  Medications   promethazine-dextromethorphan (PROMETHAZINE-DM) 6.25-15 MG/5ML syrup    Sig: Take 5 mLs by mouth 4 (four) times daily as needed.    Dispense:  118 mL    Refill:  0    Follow-up:  Return if symptoms worsen or fail to improve.  Everlene Other DO [facility]

## 2023-07-29 NOTE — Assessment & Plan Note (Signed)
This appears to be viral in origin.  Concern for COVID-19.  Advised to test at home as she already has a test available at home.  Promethazine DM for cough.  Supportive care.  Work note given.

## 2023-08-05 ENCOUNTER — Encounter: Payer: BC Managed Care – PPO | Admitting: Nurse Practitioner

## 2023-08-05 ENCOUNTER — Ambulatory Visit: Payer: BC Managed Care – PPO | Admitting: Nurse Practitioner

## 2023-08-05 ENCOUNTER — Encounter: Payer: Self-pay | Admitting: Nurse Practitioner

## 2023-08-05 VITALS — BP 116/70 | HR 107 | Temp 96.4°F | Ht 66.0 in | Wt 212.0 lb

## 2023-08-05 DIAGNOSIS — Z23 Encounter for immunization: Secondary | ICD-10-CM

## 2023-08-05 DIAGNOSIS — Z01419 Encounter for gynecological examination (general) (routine) without abnormal findings: Secondary | ICD-10-CM

## 2023-08-05 DIAGNOSIS — Z1231 Encounter for screening mammogram for malignant neoplasm of breast: Secondary | ICD-10-CM

## 2023-08-05 DIAGNOSIS — Z01411 Encounter for gynecological examination (general) (routine) with abnormal findings: Secondary | ICD-10-CM | POA: Diagnosis not present

## 2023-08-05 DIAGNOSIS — Z13228 Encounter for screening for other metabolic disorders: Secondary | ICD-10-CM

## 2023-08-05 DIAGNOSIS — Z130 Encounter for screening for diseases of the blood and blood-forming organs and certain disorders involving the immune mechanism: Secondary | ICD-10-CM

## 2023-08-05 DIAGNOSIS — Z1329 Encounter for screening for other suspected endocrine disorder: Secondary | ICD-10-CM

## 2023-08-05 DIAGNOSIS — Z13220 Encounter for screening for lipoid disorders: Secondary | ICD-10-CM

## 2023-08-05 NOTE — Progress Notes (Signed)
Subjective:    Patient ID: Rita Smith, female    DOB: Nov 22, 1965, 57 y.o.   MRN: [id number]  HPI The patient comes in today for a wellness visit.    A review of their health history was completed.  A review of medications was also completed.  Any needed refills; no  Eating habits: good; eating healthy overall  Falls/  MVA accidents in past few months: no  Regular exercise: walk daily, work out 1 to 2 per week  Specialist pt sees on regular basis: healthy weight wellness, dermatology  Preventative health issues were discussed.   Additional concerns: on weight loss journey , shingles vacc questions; getting Phentermine from weight loss clinic; no problems on 1/2 tab daily Married, same female sexual partner. No pelvic pain or vaginal bleeding. Regular dental and vision exams.    Review of Systems  Constitutional:  Negative for activity change, appetite change and fatigue.  Respiratory:  Negative for cough, chest tightness, shortness of breath and wheezing.   Cardiovascular:  Negative for chest pain.  Gastrointestinal:  Negative for abdominal distention, abdominal pain, constipation, diarrhea, nausea and vomiting.  Genitourinary:  Negative for difficulty urinating, dysuria, frequency, genital sores, menstrual problem, pelvic pain, urgency and vaginal discharge.      03/14/2023    9:11 AM  Depression screen PHQ 2/9  Decreased Interest 0  Down, Depressed, Hopeless 1  PHQ - 2 Score 1  Altered sleeping 0  Tired, decreased energy 1  Change in appetite 1  Feeling bad or failure about yourself  0  Trouble concentrating 0  Moving slowly or fidgety/restless 0  Suicidal thoughts 0  PHQ-9 Score 3  Difficult doing work/chores Somewhat difficult      09/26/2022    3:56 PM  GAD 7 : Generalized Anxiety Score  Nervous, Anxious, on Edge 0  Control/stop worrying 0  Worry too much - different things 0  Trouble relaxing 0  Restless 0  Easily annoyed or irritable 0  Afraid  - awful might happen 0  Total GAD 7 Score 0  Anxiety Difficulty Not difficult at all         Objective:   Physical Exam Vitals and nursing note reviewed.  Constitutional:      General: She is not in acute distress.    Appearance: She is well-developed.  Neck:     Thyroid: No thyromegaly.     Trachea: No tracheal deviation.     Comments: Thyroid non tender to palpation. No mass or goiter noted.  Cardiovascular:     Rate and Rhythm: Normal rate and regular rhythm.     Heart sounds: Normal heart sounds. No murmur heard. Pulmonary:     Effort: Pulmonary effort is normal.     Breath sounds: Normal breath sounds.  Chest:  Breasts:    Right: No swelling, inverted nipple, mass, skin change or tenderness.     Left: No swelling, inverted nipple, mass, skin change or tenderness.  Abdominal:     General: There is no distension.     Palpations: Abdomen is soft.     Tenderness: There is no abdominal tenderness.  Genitourinary:    Comments: Defers GU exam; denies any problems.  Musculoskeletal:     Cervical back: Normal range of motion and neck supple.  Lymphadenopathy:     Cervical: No cervical adenopathy.     Upper Body:     Right upper body: No supraclavicular, axillary or pectoral adenopathy.     Left upper  body: No supraclavicular, axillary or pectoral adenopathy.  Skin:    General: Skin is warm and dry.  Neurological:     Mental Status: She is alert and oriented to person, place, and time.  Psychiatric:        Mood and Affect: Mood normal.        Behavior: Behavior normal.        Thought Content: Thought content normal.        Judgment: Judgment normal.    Today's Vitals   08/05/23 1404  BP: 116/70  Pulse: (!) 107  Temp: (!) 96.4 F (35.8 C)  SpO2: 97%  Weight: 212 lb (96.2 kg)  Height: 5\' 6"  (1.676 m)   Body mass index is 34.22 kg/m.         Assessment & Plan:   Problem List Items Addressed This Visit   None Visit Diagnoses     Well woman exam    -   Primary   Relevant Orders   CBC with Differential/Platelet   Comprehensive metabolic panel   Lipid panel   TSH   Immunization due       Relevant Orders   Flu vaccine trivalent PF, 6mos and older(Flulaval,Afluria,Fluarix,Fluzone) (Completed)   Screening for deficiency anemia       Relevant Orders   CBC with Differential/Platelet   Screening for metabolic disorder       Relevant Orders   Comprehensive metabolic panel   Screening for lipid disorders       Relevant Orders   Lipid panel   Screening for thyroid disorder       Relevant Orders   TSH   Encounter for screening mammogram for malignant neoplasm of breast       Relevant Orders   MM DIGITAL SCREENING BILATERAL      Encouraged continued healthy eating habits and regular exercise. Tdap and Shingrix at local pharmacy due to concerns about costs. Flu vaccine today. Labs ordered. Mammogram scheduled.  Return in about 1 year (around 08/04/2024) for physical.

## 2023-08-05 NOTE — Patient Instructions (Addendum)
Tdap vaccine      Shingrix vaccine

## 2023-08-12 DIAGNOSIS — L600 Ingrowing nail: Secondary | ICD-10-CM | POA: Diagnosis not present

## 2023-08-12 DIAGNOSIS — B351 Tinea unguium: Secondary | ICD-10-CM | POA: Diagnosis not present

## 2023-08-12 DIAGNOSIS — M79675 Pain in left toe(s): Secondary | ICD-10-CM | POA: Diagnosis not present

## 2023-08-12 DIAGNOSIS — M79674 Pain in right toe(s): Secondary | ICD-10-CM | POA: Diagnosis not present

## 2023-08-21 DIAGNOSIS — Z13220 Encounter for screening for lipoid disorders: Secondary | ICD-10-CM | POA: Diagnosis not present

## 2023-08-21 DIAGNOSIS — Z01419 Encounter for gynecological examination (general) (routine) without abnormal findings: Secondary | ICD-10-CM | POA: Diagnosis not present

## 2023-08-21 DIAGNOSIS — Z13228 Encounter for screening for other metabolic disorders: Secondary | ICD-10-CM | POA: Diagnosis not present

## 2023-08-21 DIAGNOSIS — Z130 Encounter for screening for diseases of the blood and blood-forming organs and certain disorders involving the immune mechanism: Secondary | ICD-10-CM | POA: Diagnosis not present

## 2023-08-22 LAB — LIPID PANEL
Chol/HDL Ratio: 2.8 {ratio} (ref 0.0–4.4)
Cholesterol, Total: 217 mg/dL — ABNORMAL HIGH (ref 100–199)
HDL: 78 mg/dL (ref >39)
LDL Chol Calc (NIH): 119 mg/dL — ABNORMAL HIGH (ref 0–99)
Triglycerides: 115 mg/dL (ref 0–149)
VLDL Cholesterol Cal: 20 mg/dL (ref 5–40)

## 2023-08-22 LAB — CBC WITH DIFFERENTIAL/PLATELET
Basophils Absolute: 0 10*3/uL (ref 0.0–0.2)
Basos: 0 %
EOS (ABSOLUTE): 0.2 10*3/uL (ref 0.0–0.4)
Eos: 3 %
Hematocrit: 47.2 % — ABNORMAL HIGH (ref 34.0–46.6)
Hemoglobin: 16.1 g/dL — ABNORMAL HIGH (ref 11.1–15.9)
Immature Grans (Abs): 0 10*3/uL (ref 0.0–0.1)
Immature Granulocytes: 0 %
Lymphocytes Absolute: 1.1 10*3/uL (ref 0.7–3.1)
Lymphs: 21 %
MCH: 30.8 pg (ref 26.6–33.0)
MCHC: 34.1 g/dL (ref 31.5–35.7)
MCV: 90 fL (ref 79–97)
Monocytes Absolute: 0.4 10*3/uL (ref 0.1–0.9)
Monocytes: 9 %
Neutrophils Absolute: 3.3 10*3/uL (ref 1.4–7.0)
Neutrophils: 67 %
Platelets: 276 10*3/uL (ref 150–450)
RBC: 5.23 x10E6/uL (ref 3.77–5.28)
RDW: 12.1 % (ref 11.7–15.4)
WBC: 5.1 10*3/uL (ref 3.4–10.8)

## 2023-08-22 LAB — COMPREHENSIVE METABOLIC PANEL
ALT: 48 [IU]/L — ABNORMAL HIGH (ref 0–32)
AST: 33 [IU]/L (ref 0–40)
Albumin: 4.2 g/dL (ref 3.8–4.9)
Alkaline Phosphatase: 73 [IU]/L (ref 44–121)
BUN/Creatinine Ratio: 15 (ref 9–23)
BUN: 12 mg/dL (ref 6–24)
Bilirubin Total: 0.6 mg/dL (ref 0.0–1.2)
CO2: 22 mmol/L (ref 20–29)
Calcium: 9.6 mg/dL (ref 8.7–10.2)
Chloride: 103 mmol/L (ref 96–106)
Creatinine, Ser: 0.82 mg/dL (ref 0.57–1.00)
Globulin, Total: 2.8 g/dL (ref 1.5–4.5)
Glucose: 91 mg/dL (ref 70–99)
Potassium: 4.2 mmol/L (ref 3.5–5.2)
Sodium: 138 mmol/L (ref 134–144)
Total Protein: 7 g/dL (ref 6.0–8.5)
eGFR: 83 mL/min/{1.73_m2} (ref >59)

## 2023-08-22 LAB — TSH: TSH: 3.4 u[IU]/mL (ref 0.450–4.500)

## 2023-08-23 DIAGNOSIS — Z23 Encounter for immunization: Secondary | ICD-10-CM | POA: Diagnosis not present

## 2023-09-18 ENCOUNTER — Ambulatory Visit (HOSPITAL_COMMUNITY)
Admission: RE | Admit: 2023-09-18 | Discharge: 2023-09-18 | Disposition: A | Discharge status: Home / Self Care | Payer: BC Managed Care – PPO | Source: Ambulatory Visit | Attending: Nurse Practitioner | Admitting: Nurse Practitioner

## 2023-09-18 ENCOUNTER — Other Ambulatory Visit (HOSPITAL_COMMUNITY): Payer: Self-pay

## 2023-09-18 DIAGNOSIS — G43009 Migraine without aura, not intractable, without status migrainosus: Secondary | ICD-10-CM | POA: Diagnosis not present

## 2023-09-18 DIAGNOSIS — Z1231 Encounter for screening mammogram for malignant neoplasm of breast: Secondary | ICD-10-CM | POA: Insufficient documentation

## 2023-09-18 DIAGNOSIS — E782 Mixed hyperlipidemia: Secondary | ICD-10-CM | POA: Diagnosis not present

## 2023-09-18 MED ORDER — WEGOVY 0.25 MG/0.5ML ~~LOC~~ SOAJ
0.2500 mg | SUBCUTANEOUS | 0 refills | Status: DC
  Filled 2023-09-18: qty 2, 28d supply, fill #0

## 2023-09-23 ENCOUNTER — Other Ambulatory Visit (HOSPITAL_COMMUNITY): Payer: Self-pay | Admitting: Nurse Practitioner

## 2023-09-23 DIAGNOSIS — R928 Other abnormal and inconclusive findings on diagnostic imaging of breast: Secondary | ICD-10-CM

## 2023-09-24 ENCOUNTER — Telehealth: Payer: Self-pay | Admitting: *Deleted

## 2023-09-24 NOTE — Telephone Encounter (Unsigned)
Copied from CRM [id number]. Topic: Clinical - Lab/Test Results >> Sep 24, 2023  1:03 PM Donita Brooks wrote: Reason for CRM: pt would for Sherie Don to call back to discuss results and what do moving forward. Callback number [phone]

## 2023-09-25 ENCOUNTER — Encounter: Payer: Self-pay | Admitting: Nurse Practitioner

## 2023-09-25 NOTE — Telephone Encounter (Signed)
Mychart message sent.

## 2023-09-30 ENCOUNTER — Other Ambulatory Visit (HOSPITAL_COMMUNITY): Payer: Self-pay

## 2023-10-24 ENCOUNTER — Ambulatory Visit (HOSPITAL_COMMUNITY)
Admission: RE | Admit: 2023-10-24 | Discharge: 2023-10-24 | Disposition: A | Discharge status: Home / Self Care | Payer: BC Managed Care – PPO | Source: Ambulatory Visit | Attending: Nurse Practitioner | Admitting: Nurse Practitioner

## 2023-10-24 DIAGNOSIS — R92333 Mammographic heterogeneous density, bilateral breasts: Secondary | ICD-10-CM | POA: Diagnosis not present

## 2023-10-24 DIAGNOSIS — R928 Other abnormal and inconclusive findings on diagnostic imaging of breast: Secondary | ICD-10-CM | POA: Diagnosis not present

## 2023-11-05 ENCOUNTER — Other Ambulatory Visit (HOSPITAL_COMMUNITY): Payer: BC Managed Care – PPO

## 2023-11-05 ENCOUNTER — Encounter (HOSPITAL_COMMUNITY): Payer: BC Managed Care – PPO

## 2023-11-06 ENCOUNTER — Ambulatory Visit: Payer: BC Managed Care – PPO | Admitting: Family Medicine

## 2023-11-06 ENCOUNTER — Encounter: Payer: Self-pay | Admitting: Family Medicine

## 2023-11-06 VITALS — BP 116/78 | HR 95 | Temp 97.5°F | Ht 66.0 in | Wt 217.0 lb

## 2023-11-06 DIAGNOSIS — J019 Acute sinusitis, unspecified: Secondary | ICD-10-CM | POA: Diagnosis not present

## 2023-11-06 MED ORDER — AMOXICILLIN 500 MG PO CAPS: ORAL_CAPSULE | ORAL | 0 refills | Status: DC

## 2023-11-06 NOTE — Progress Notes (Signed)
. ..    Subjective:    Patient ID: Rita Smith, female    DOB: [birth year], 58 y.o.   MRN: [id number]  HPINasal congestion. Facial pain, left ear pain and productive cough x's 3 days. No fever. Has taken dayquil to some relief. Discussed the use of AI scribe software for clinical note transcription with the patient, who gave verbal consent to proceed.  History of Present Illness   The patient presents with symptoms of a respiratory infection, including nasal congestion, cough, and low-grade fever. She reports difficulty breathing, particularly when transitioning from warm to cold environments, such as entering and exiting her car. She also describes a sensation of 'weirdness' in her sinuses and pressure in her left ear.  The patient's symptoms have been accompanied by a runny nose, with the discharge described as green or grayish in color. She also reports soreness in her cheekbone area, suggesting possible sinus involvement.  The patient's symptoms appear to have started recently, coinciding with a reported increase in respiratory illnesses in her community. 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  The patient has not reported any significant family illnesses recently. She has not indicated any changes in her medication regimen or any new medications. She has not reported any other health issues at this time.       Review of Systems     Objective:   Physical Exam  Gen-NAD not toxic TMS-normal bilateral T- normal no redness Chest-CTA respiratory rate normal no crackles CV RRR no murmur Skin-warm dry Neuro-grossly normal Moderate tenderness of the legs left maxillary      Assessment & Plan:  Assessment and Plan    Upper Respiratory Infection progressing to Sinusitis Presents with congestion, cough, low-grade fever, and sinus discomfort. No signs of pneumonia on auscultation. Greenish-gray nasal discharge and sinus tenderness suggest  bacterial sinusitis. -Prescribe Amoxicillin  500mg  TID for 7 days. -Expect improvement by Monday, November 11, 2023. -If no improvement, patient to send MyChart message for further evaluation.  General Health Maintenance / Followup Plans -Provide sick note for potential absence from work on November 07, 2023. -Follow-up if signs of respiratory distress develop.     Examination consistent with sinusitis secondary to recent viral illness warning signs discussed in detail progress Follow-up if progressive troubles or worse

## 2023-11-11 DIAGNOSIS — L600 Ingrowing nail: Secondary | ICD-10-CM | POA: Diagnosis not present

## 2023-11-11 DIAGNOSIS — M79675 Pain in left toe(s): Secondary | ICD-10-CM | POA: Diagnosis not present

## 2023-11-11 DIAGNOSIS — M79674 Pain in right toe(s): Secondary | ICD-10-CM | POA: Diagnosis not present

## 2023-11-11 DIAGNOSIS — B351 Tinea unguium: Secondary | ICD-10-CM | POA: Diagnosis not present

## 2023-11-19 ENCOUNTER — Other Ambulatory Visit (HOSPITAL_COMMUNITY): Payer: Self-pay

## 2023-11-19 DIAGNOSIS — E782 Mixed hyperlipidemia: Secondary | ICD-10-CM | POA: Diagnosis not present

## 2023-11-19 DIAGNOSIS — G43009 Migraine without aura, not intractable, without status migrainosus: Secondary | ICD-10-CM | POA: Diagnosis not present

## 2023-11-19 DIAGNOSIS — Z9189 Other specified personal risk factors, not elsewhere classified: Secondary | ICD-10-CM | POA: Diagnosis not present

## 2023-11-19 MED ORDER — WEGOVY 0.25 MG/0.5ML ~~LOC~~ SOAJ
0.2500 mg | SUBCUTANEOUS | 0 refills | Status: DC
  Filled 2023-11-19: qty 2, 28d supply, fill #0

## 2023-11-26 ENCOUNTER — Other Ambulatory Visit (HOSPITAL_COMMUNITY): Payer: Self-pay

## 2023-12-03 ENCOUNTER — Ambulatory Visit: Payer: BC Managed Care – PPO | Admitting: Family Medicine

## 2023-12-03 VITALS — BP 98/65 | HR 125 | Temp 98.5°F | Ht 66.0 in | Wt 211.0 lb

## 2023-12-03 DIAGNOSIS — J111 Influenza due to unidentified influenza virus with other respiratory manifestations: Secondary | ICD-10-CM | POA: Insufficient documentation

## 2023-12-03 MED ORDER — OSELTAMIVIR PHOSPHATE 75 MG PO CAPS: 75.0000 mg | ORAL_CAPSULE | Freq: Two times a day (BID) | ORAL | 0 refills | Status: DC

## 2023-12-03 NOTE — Assessment & Plan Note (Signed)
Clinical picture consistent with influenza.  This is an acute illness with systemic symptoms.  Treating with Tamiflu.  Work note given.  Advise rest and supportive care.

## 2023-12-03 NOTE — Progress Notes (Signed)
Subjective:  Patient ID: Rita Smith, female    DOB: December 12, 1965  Age: 58 y.o. MRN: [id number]  CC:   Chief Complaint  Patient presents with   Fever    Sweats, weakness, chills since Sunday  taken ibuprofen , coricidin   deep cough     Non productive     HPI:  58 year old female presents for evaluation of the above.  Symptoms started on Sunday.  She reports fever, sweats, generalized weakness, chills.  She is also had a deep cough which has been nonproductive.  Associated congestion as well.  She works as a Chartered loss adjuster.  There has been a lot of influenza in our community.  She has been resting and pushing fluids.  Patient Active Problem List   Diagnosis Date Noted   Influenza 12/03/2023   Body mass index 34.0-34.9, adult 05/16/2022   Varicose vein of leg 05/17/2014   Uveitis 05/12/2013   Menorrhagia 05/11/2013   MIGRAINES, HX OF 02/02/2009    Social Hx   Social History   Socioeconomic History   Marital status: Married    Spouse name: Not on file   Number of children: 2   Years of education: Not on file   Highest education level: Not on file  Occupational History   Not on file  Tobacco Use   Smoking status: Never   Smokeless tobacco: Never  Vaping Use   Vaping status: Never Used  Substance and Sexual Activity   Alcohol use: Yes    Comment: socially    Drug use: No   Sexual activity: Yes    Birth control/protection: Condom, Post-menopausal  Other Topics Concern   Not on file  Social History Narrative   Not on file   Social Drivers of Health   Financial Resource Strain: Not on file  Food Insecurity: Not on file  Transportation Needs: Not on file  Physical Activity: Not on file  Stress: Not on file  Social Connections: Not on file    Review of Systems Per HPI  Objective:  BP 98/65   Pulse (!) 125   Temp 98.5 F (36.9 C)   Ht 5\' 6"  (1.676 m)   Wt 211 lb (95.7 kg)   LMP 02/13/2021 (Approximate) Comment: spotting  SpO2 96%   BMI 34.06  kg/m      12/03/2023    1:47 PM 11/06/2023    4:34 PM 11/06/2023    4:25 PM  BP/Weight  Systolic BP 98 116 144  Diastolic BP 65 78 67  Wt. (Lbs) 211  217  BMI 34.06 kg/m2  35.02 kg/m2    Physical Exam Constitutional:      General: She is not in acute distress. HENT:     Head: Normocephalic and atraumatic.     Mouth/Throat:     Mouth: Mucous membranes are moist.     Pharynx: Oropharynx is clear.  Cardiovascular:     Rate and Rhythm: Regular rhythm. Tachycardia present.  Pulmonary:     Effort: Pulmonary effort is normal.     Breath sounds: No wheezing or rales.  Neurological:     Mental Status: She is alert.     Lab Results  Component Value Date   WBC 5.1 08/21/2023   HGB 16.1 (H) 08/21/2023   HCT 47.2 (H) 08/21/2023   PLT 276 08/21/2023   GLUCOSE 91 08/21/2023   CHOL 217 (H) 08/21/2023   TRIG 115 08/21/2023   HDL 78 08/21/2023   LDLCALC 119 (H) 08/21/2023  ALT 48 (H) 08/21/2023   AST 33 08/21/2023   NA 138 08/21/2023   K 4.2 08/21/2023   CL 103 08/21/2023   CREATININE 0.82 08/21/2023   BUN 12 08/21/2023   CO2 22 08/21/2023   TSH 3.400 08/21/2023   HGBA1C 5.4 10/26/2019     Assessment & Plan:   Problem List Items Addressed This Visit       Respiratory   Influenza - Primary   Clinical picture consistent with influenza.  This is an acute illness with systemic symptoms.  Treating with Tamiflu.  Work note given.  Advise rest and supportive care.      Relevant Medications   oseltamivir (TAMIFLU) 75 MG capsule    Meds ordered this encounter  Medications   oseltamivir (TAMIFLU) 75 MG capsule    Sig: Take 1 capsule (75 mg total) by mouth 2 (two) times daily.    Dispense:  10 capsule    Refill:  0    Follow-up:  Return if symptoms worsen or fail to improve.  Everlene Other DO [facility]

## 2024-01-01 DIAGNOSIS — E782 Mixed hyperlipidemia: Secondary | ICD-10-CM | POA: Diagnosis not present

## 2024-01-01 DIAGNOSIS — G43009 Migraine without aura, not intractable, without status migrainosus: Secondary | ICD-10-CM | POA: Diagnosis not present

## 2024-01-01 DIAGNOSIS — Z9189 Other specified personal risk factors, not elsewhere classified: Secondary | ICD-10-CM | POA: Diagnosis not present

## 2024-02-12 DIAGNOSIS — Z9189 Other specified personal risk factors, not elsewhere classified: Secondary | ICD-10-CM | POA: Diagnosis not present

## 2024-02-12 DIAGNOSIS — G43009 Migraine without aura, not intractable, without status migrainosus: Secondary | ICD-10-CM | POA: Diagnosis not present

## 2024-02-12 DIAGNOSIS — E782 Mixed hyperlipidemia: Secondary | ICD-10-CM | POA: Diagnosis not present

## 2024-03-09 ENCOUNTER — Ambulatory Visit: Payer: Self-pay | Admitting: *Deleted
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
## 2024-03-11 ENCOUNTER — Encounter: Payer: Self-pay | Admitting: Family Medicine

## 2024-03-11 ENCOUNTER — Ambulatory Visit: Payer: Self-pay | Admitting: Family Medicine

## 2024-03-11 ENCOUNTER — Ambulatory Visit: Admitting: Family Medicine

## 2024-03-11 VITALS — BP 116/74 | HR 92 | Temp 97.7°F | Ht 66.0 in | Wt 211.0 lb

## 2024-03-11 DIAGNOSIS — R748 Abnormal levels of other serum enzymes: Secondary | ICD-10-CM

## 2024-03-11 DIAGNOSIS — M25562 Pain in left knee: Secondary | ICD-10-CM

## 2024-03-11 DIAGNOSIS — R0789 Other chest pain: Secondary | ICD-10-CM

## 2024-03-11 DIAGNOSIS — M545 Low back pain, unspecified: Secondary | ICD-10-CM | POA: Diagnosis not present

## 2024-03-11 DIAGNOSIS — D582 Other hemoglobinopathies: Secondary | ICD-10-CM

## 2024-03-11 DIAGNOSIS — E66811 Obesity, class 1: Secondary | ICD-10-CM

## 2024-03-11 DIAGNOSIS — E785 Hyperlipidemia, unspecified: Secondary | ICD-10-CM
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
## 2024-03-12 ENCOUNTER — Other Ambulatory Visit: Payer: Self-pay

## 2024-03-12 DIAGNOSIS — E785 Hyperlipidemia, unspecified: Secondary | ICD-10-CM

## 2024-03-12 DIAGNOSIS — R748 Abnormal levels of other serum enzymes: Secondary | ICD-10-CM

## 2024-03-12 DIAGNOSIS — D582 Other hemoglobinopathies: Secondary | ICD-10-CM

## 2024-03-12 DIAGNOSIS — Z79899 Other long term (current) drug therapy: Secondary | ICD-10-CM

## 2024-03-19 DIAGNOSIS — L821 Other seborrheic keratosis: Secondary | ICD-10-CM | POA: Diagnosis not present

## 2024-03-19 DIAGNOSIS — L218 Other seborrheic dermatitis: Secondary | ICD-10-CM | POA: Diagnosis not present

## 2024-03-19 DIAGNOSIS — Z1283 Encounter for screening for malignant neoplasm of skin: Secondary | ICD-10-CM | POA: Diagnosis not present

## 2024-03-19 DIAGNOSIS — D225 Melanocytic nevi of trunk: Secondary | ICD-10-CM | POA: Diagnosis not present

## 2024-03-31 DIAGNOSIS — E669 Obesity, unspecified: Secondary | ICD-10-CM | POA: Diagnosis not present

## 2024-03-31 DIAGNOSIS — E782 Mixed hyperlipidemia: Secondary | ICD-10-CM | POA: Diagnosis not present

## 2024-03-31 DIAGNOSIS — G43009 Migraine without aura, not intractable, without status migrainosus: Secondary | ICD-10-CM | POA: Diagnosis not present

## 2024-03-31 DIAGNOSIS — Z6833 Body mass index (BMI) 33.0-33.9, adult: Secondary | ICD-10-CM | POA: Diagnosis not present

## 2024-04-13 DIAGNOSIS — M79675 Pain in left toe(s): Secondary | ICD-10-CM | POA: Diagnosis not present

## 2024-04-13 DIAGNOSIS — M79674 Pain in right toe(s): Secondary | ICD-10-CM | POA: Diagnosis not present

## 2024-04-13 DIAGNOSIS — B351 Tinea unguium: Secondary | ICD-10-CM | POA: Diagnosis not present

## 2024-05-13 DIAGNOSIS — E669 Obesity, unspecified: Secondary | ICD-10-CM | POA: Diagnosis not present

## 2024-05-13 DIAGNOSIS — E782 Mixed hyperlipidemia: Secondary | ICD-10-CM | POA: Diagnosis not present

## 2024-05-13 DIAGNOSIS — Z9189 Other specified personal risk factors, not elsewhere classified: Secondary | ICD-10-CM | POA: Diagnosis not present

## 2024-05-13 DIAGNOSIS — G43009 Migraine without aura, not intractable, without status migrainosus: Secondary | ICD-10-CM | POA: Diagnosis not present

## 2024-06-05 ENCOUNTER — Encounter: Payer: Self-pay | Admitting: Adult Health

## 2024-06-05 ENCOUNTER — Ambulatory Visit: Admitting: Adult Health

## 2024-06-05 VITALS — BP 119/75 | HR 111 | Ht 66.5 in | Wt 217.0 lb

## 2024-06-05 DIAGNOSIS — L720 Epidermal cyst: Secondary | ICD-10-CM | POA: Diagnosis not present

## 2024-06-05 DIAGNOSIS — N950 Postmenopausal bleeding: Secondary | ICD-10-CM | POA: Diagnosis not present

## 2024-06-05 NOTE — Progress Notes (Signed)
  Subjective:     Patient ID: Rita Smith, female   DOB: 1966-09-14, 58 y.o.   MRN: [id number]  HPI Rita Smith is a 58 year old white female, married, PM in complaining of vaginal bleeding, last week when on vacation it was pink, has felt bloated too.  She had PMB in 2022 and had normal US , EEC was 2.6 mm      Component Value Date/Time   DIAGPAP  05/16/2022 1117    - Negative for intraepithelial lesion or malignancy (NILM)   DIAGPAP  09/24/2019 1544    - Negative for intraepithelial lesion or malignancy (NILM)   HPVHIGH Negative 05/16/2022 1117   HPVHIGH Negative 09/24/2019 1544   ADEQPAP  05/16/2022 1117    Satisfactory for evaluation; transformation zone component PRESENT.   ADEQPAP  09/24/2019 1544    Satisfactory for evaluation; transformation zone component PRESENT.   PCP is Dr Alphonsa  Review of Systems +vaginal bleeding, last week when on vacation it was pink, not heavy, has felt bloated too.    Reviewed past medical,surgical, social and family history. Reviewed medications and allergies.  Objective:   Physical Exam BP 119/75 (BP Location: Left Arm, Patient Position: Sitting, Cuff Size: Large)   Pulse (!) 111   Ht 5' 6.5 (1.689 m)   Wt 217 lb (98.4 kg)   LMP 02/13/2021 (Approximate) Comment: spotting  BMI 34.50 kg/m     Skin warm and dry.Pelvic: external genitalia is normal in appearance, has epidermal cyst base left labia, vagina: pale, no bleeding noted,urethra has no lesions or masses noted, cervix:bulbous, uterus: normal size, shape and contour, non tender, no masses felt, adnexa: no masses or tenderness noted. Bladder is non tender and no masses felt. Fall risk is low  Upstream - 06/05/24 1034       Pregnancy Intention Screening   Does the patient want to become pregnant in the next year? N/A    Does the patient's partner want to become pregnant in the next year? N/A    Would the patient like to discuss contraceptive options today? N/A      Contraception Wrap  Up   Current Method No Method - Other Reason   PM   End Method No Method - Other Reason   PM   Contraception Counseling Provided No         Examination chaperoned by Clarita Salt LPN Assessment:     1. PMB (postmenopausal bleeding) (Primary) +vaginal bleeding last week, was pink Has felt bloated Will get Pelvic US  at [facility] 06/12/24 at 12:30 pm to assess uterus and ovaries, will talk when results back  - US  PELVIC COMPLETE WITH TRANSVAGINAL; Future  2. Epidermal cyst Do not squeeze can try warm compress     Plan:     Follow up TBD

## 2024-06-12 ENCOUNTER — Ambulatory Visit (HOSPITAL_COMMUNITY)
Admission: RE | Admit: 2024-06-12 | Discharge: 2024-06-12 | Disposition: A | Discharge status: Home / Self Care | Source: Ambulatory Visit | Attending: Adult Health | Admitting: Adult Health

## 2024-06-12 DIAGNOSIS — R9389 Abnormal findings on diagnostic imaging of other specified body structures: Secondary | ICD-10-CM | POA: Diagnosis not present

## 2024-06-12 DIAGNOSIS — N888 Other specified noninflammatory disorders of cervix uteri: Secondary | ICD-10-CM | POA: Diagnosis not present

## 2024-06-12 DIAGNOSIS — N950 Postmenopausal bleeding: Secondary | ICD-10-CM | POA: Insufficient documentation

## 2024-06-23 ENCOUNTER — Ambulatory Visit: Payer: Self-pay | Admitting: Adult Health

## 2024-06-23 DIAGNOSIS — N950 Postmenopausal bleeding: Secondary | ICD-10-CM

## 2024-06-26 ENCOUNTER — Encounter: Payer: Self-pay | Admitting: Family Medicine

## 2024-06-26 ENCOUNTER — Ambulatory Visit: Admitting: Family Medicine

## 2024-06-26 VITALS — BP 107/69 | HR 84 | Temp 97.3°F | Ht 66.5 in | Wt 214.0 lb

## 2024-06-26 DIAGNOSIS — E785 Hyperlipidemia, unspecified: Secondary | ICD-10-CM

## 2024-06-26 DIAGNOSIS — Z23 Encounter for immunization: Secondary | ICD-10-CM | POA: Diagnosis not present

## 2024-06-26 DIAGNOSIS — E66811 Obesity, class 1: Secondary | ICD-10-CM

## 2024-06-26 NOTE — Progress Notes (Signed)
   Subjective:    Patient ID: Rita Smith, female    DOB: 11-Jan-1966, 58 y.o.   MRN: [id number]  HPI  Follow up  upcoming cervical biopsy due to spotting - [facility] The 10-year ASCVD risk score (Arnett DK, et al., 2019) is: 1.7%   Values used to calculate the score:     Age: 58 years     Clincally relevant sex: Female     Is Non-Hispanic African American: No     Diabetic: No     Tobacco smoker: No     Systolic Blood Pressure: 107 mmHg     Is BP treated: No     HDL Cholesterol: 78 mg/dL     Total Cholesterol: 217 mg/dL Patient relates she is trying to keep her weight down Her respiratory status and allergies seem to be hanging in there Her recent lab work shows cholesterol issues are significant but not enough to be on statins Review of Systems     Objective:   Physical Exam  General-in no acute distress Eyes-no discharge Lungs-respiratory rate normal, CTA CV-no murmurs,RRR Extremities skin warm dry no edema Neuro grossly normal Behavior normal, alert       Assessment & Plan:  1. Immunization due (Primary) Today - Flu vaccine trivalent PF, 6mos and older(Flulaval,Afluria,Fluarix,Fluzone)  2. Obesity (BMI 30.0-34.9) Portion control regular physical activity  3. Hyperlipidemia, unspecified hyperlipidemia type Check lab work healthy diet  Patient already has labs ordered please do these Follow-up by summertime.  Sooner if any problems

## 2024-06-29 DIAGNOSIS — E785 Hyperlipidemia, unspecified: Secondary | ICD-10-CM | POA: Diagnosis not present

## 2024-06-29 DIAGNOSIS — Z79899 Other long term (current) drug therapy: Secondary | ICD-10-CM | POA: Diagnosis not present

## 2024-06-29 DIAGNOSIS — D582 Other hemoglobinopathies: Secondary | ICD-10-CM | POA: Diagnosis not present

## 2024-06-29 DIAGNOSIS — R748 Abnormal levels of other serum enzymes: Secondary | ICD-10-CM | POA: Diagnosis not present

## 2024-06-30 ENCOUNTER — Ambulatory Visit: Payer: Self-pay | Admitting: Family Medicine

## 2024-06-30 LAB — HEPATIC FUNCTION PANEL
ALT: 35 IU/L — ABNORMAL HIGH (ref 0–32)
AST: 30 IU/L (ref 0–40)
Albumin: 4.3 g/dL (ref 3.8–4.9)
Alkaline Phosphatase: 62 IU/L (ref 44–121)
Bilirubin Total: 0.3 mg/dL (ref 0.0–1.2)
Bilirubin, Direct: 0.12 mg/dL (ref 0.00–0.40)
Total Protein: 6.9 g/dL (ref 6.0–8.5)

## 2024-06-30 LAB — CBC WITH DIFFERENTIAL/PLATELET
Basophils Absolute: 0 x10E3/uL (ref 0.0–0.2)
Basos: 1 %
EOS (ABSOLUTE): 0.1 x10E3/uL (ref 0.0–0.4)
Eos: 3 %
Hematocrit: 49.4 % — ABNORMAL HIGH (ref 34.0–46.6)
Hemoglobin: 15.7 g/dL (ref 11.1–15.9)
Immature Grans (Abs): 0 x10E3/uL (ref 0.0–0.1)
Immature Granulocytes: 0 %
Lymphocytes Absolute: 1.1 x10E3/uL (ref 0.7–3.1)
Lymphs: 25 %
MCH: 29.1 pg (ref 26.6–33.0)
MCHC: 31.8 g/dL (ref 31.5–35.7)
MCV: 92 fL (ref 79–97)
Monocytes Absolute: 0.7 x10E3/uL (ref 0.1–0.9)
Monocytes: 16 %
Neutrophils Absolute: 2.4 x10E3/uL (ref 1.4–7.0)
Neutrophils: 54 %
Platelets: 246 x10E3/uL (ref 150–450)
RBC: 5.39 x10E6/uL — ABNORMAL HIGH (ref 3.77–5.28)
RDW: 12.4 % (ref 11.7–15.4)
WBC: 4.3 x10E3/uL (ref 3.4–10.8)

## 2024-06-30 LAB — LIPID PANEL
Chol/HDL Ratio: 2.7 ratio (ref 0.0–4.4)
Cholesterol, Total: 196 mg/dL (ref 100–199)
HDL: 72 mg/dL (ref >39)
LDL Chol Calc (NIH): 104 mg/dL — ABNORMAL HIGH (ref 0–99)
Triglycerides: 117 mg/dL (ref 0–149)
VLDL Cholesterol Cal: 20 mg/dL (ref 5–40)

## 2024-06-30 LAB — BASIC METABOLIC PANEL WITH GFR
BUN/Creatinine Ratio: 11 (ref 9–23)
BUN: 10 mg/dL (ref 6–24)
CO2: 21 mmol/L (ref 20–29)
Calcium: 9.5 mg/dL (ref 8.7–10.2)
Chloride: 104 mmol/L (ref 96–106)
Creatinine, Ser: 0.94 mg/dL (ref 0.57–1.00)
Glucose: 88 mg/dL (ref 70–99)
Potassium: 4.1 mmol/L (ref 3.5–5.2)
Sodium: 140 mmol/L (ref 134–144)
eGFR: 70 mL/min/1.73 (ref >59)

## 2024-07-07 ENCOUNTER — Ambulatory Visit (INDEPENDENT_AMBULATORY_CARE_PROVIDER_SITE_OTHER): Admitting: Obstetrics & Gynecology

## 2024-07-07 ENCOUNTER — Encounter: Payer: Self-pay | Admitting: Obstetrics & Gynecology

## 2024-07-07 ENCOUNTER — Other Ambulatory Visit (HOSPITAL_COMMUNITY)
Admission: RE | Admit: 2024-07-07 | Discharge: 2024-07-07 | Disposition: A | Discharge status: Home / Self Care | Source: Ambulatory Visit | Attending: Obstetrics & Gynecology | Admitting: Obstetrics & Gynecology

## 2024-07-07 VITALS — BP 136/85 | HR 94 | Ht 66.5 in | Wt 216.0 lb

## 2024-07-07 DIAGNOSIS — R9389 Abnormal findings on diagnostic imaging of other specified body structures: Secondary | ICD-10-CM | POA: Insufficient documentation

## 2024-07-07 DIAGNOSIS — N950 Postmenopausal bleeding: Secondary | ICD-10-CM | POA: Insufficient documentation

## 2024-07-07 NOTE — Progress Notes (Signed)
 Endometrial Biopsy Procedure Note  Pre-operative Diagnosis: Post menopausal bleeding with thickened endometrium 14 mm on sonogram  Atrophic small uterus, very difficult cervix to get to, way up under her pubis wit a flat pubic arch, required 2 sheets doubled up under her buttocks, cervix also quite small  Post-operative Diagnosis: same  Indications: postmenopausal bleeding  Procedure Details   Urine pregnancy test was not done.  The risks (including infection, bleeding, pain, and uterine perforation) and benefits of the procedure were explained to the patient and Written informed consent was obtained.  Antibiotic prophylaxis against endocarditis was not indicated.   The patient was placed in the dorsal lithotomy position.  Bimanual exam showed the uterus to be in the neutral position.  A Graves' speculum inserted in the vagina, and the cervix prepped with povidone iodine.  Endocervical curettage with a Kevorkian curette was not performed.   A sharp tenaculum was applied to the anterior lip of the cervix for stabilization.  A sterile uterine sound was used to sound the uterus to a depth of 6.5 cm.  A Pipelle endometrial aspirator was used to sample the endometrium.  Sample was sent for pathologic examination.  Condition: Stable  Complications: None  Plan:  The patient was advised to call for any fever or for prolonged or severe pain or bleeding. She was advised to use OTC analgesics as needed for mild to moderate pain. She was advised to avoid vaginal intercourse for 48 hours or until the bleeding has completely stopped.  Attending Physician Documentation: I was present for or performed the following: endometrial biopsy

## 2024-07-07 NOTE — Addendum Note (Signed)
 Addended by: BERNARDO ALAN CROME on: 07/07/2024 02:53 PM   Modules accepted: Orders

## 2024-07-09 LAB — SURGICAL PATHOLOGY

## 2024-07-13 DIAGNOSIS — Z6833 Body mass index (BMI) 33.0-33.9, adult: Secondary | ICD-10-CM | POA: Diagnosis not present

## 2024-07-13 DIAGNOSIS — E782 Mixed hyperlipidemia: Secondary | ICD-10-CM | POA: Diagnosis not present

## 2024-07-13 DIAGNOSIS — M79675 Pain in left toe(s): Secondary | ICD-10-CM | POA: Diagnosis not present

## 2024-07-13 DIAGNOSIS — B351 Tinea unguium: Secondary | ICD-10-CM | POA: Diagnosis not present

## 2024-07-13 DIAGNOSIS — E669 Obesity, unspecified: Secondary | ICD-10-CM | POA: Diagnosis not present

## 2024-07-13 DIAGNOSIS — G43009 Migraine without aura, not intractable, without status migrainosus: Secondary | ICD-10-CM | POA: Diagnosis not present

## 2024-07-13 DIAGNOSIS — M79674 Pain in right toe(s): Secondary | ICD-10-CM | POA: Diagnosis not present

## 2024-07-14 ENCOUNTER — Encounter: Payer: Self-pay | Admitting: Obstetrics & Gynecology

## 2024-07-14 ENCOUNTER — Telehealth: Admitting: Obstetrics & Gynecology

## 2024-07-14 DIAGNOSIS — R9389 Abnormal findings on diagnostic imaging of other specified body structures: Secondary | ICD-10-CM | POA: Diagnosis not present

## 2024-07-14 DIAGNOSIS — N950 Postmenopausal bleeding: Secondary | ICD-10-CM | POA: Diagnosis not present

## 2024-07-14 MED ORDER — PROGESTERONE 200 MG PO CAPS: ORAL_CAPSULE | ORAL | 3 refills | Status: AC

## 2024-07-14 NOTE — Progress Notes (Signed)
 My Chart connect visit: video + audio Patient is at work I am in my office Total time: 10 minutes  Follow up appointment for results: EMBx  Chief Complaint  Patient presents with   Follow-up    results    Last menstrual period 02/13/2021.  Endometrial biopsy: Benign endometrium no polyps atypia hyperplasia or malignancy Inactive endometrium  With a 14 mm stripe I feel she would benefit from cyclical withdrawal to get a feel for her bleeding profile  Take it monthly x 10 d Reconvene January after 3 months to assess bleeding progression or regression   MEDS ordered this encounter: Meds ordered this encounter  Medications   progesterone  (PROMETRIUM ) 200 MG capsule    Sig: Take as directed at bedtime    Dispense:  30 capsule    Refill:  3    Orders for this encounter: No orders of the defined types were placed in this encounter.   Impression + Management Plan   ICD-10-CM   1. Thickened endometrium 14 mm  R93.89     2. PMB (postmenopausal bleeding)  N95.0       Follow Up: Return in about 4 months (around 10/30/2024) for Follow up, with Dr Jayne, MyChart Connect visit.     All questions were answered.  Past Medical History:  Diagnosis Date   Colitis    Headache(784.0)    Rosacea    Urinary frequency 06/15/2015   Uveitis    Varicose vein of leg 05/17/2014   Vitamin D  deficiency     Past Surgical History:  Procedure Laterality Date   CESAREAN SECTION     COLONOSCOPY     COLONOSCOPY WITH PROPOFOL  N/A 01/30/2022   Procedure: COLONOSCOPY WITH PROPOFOL ;  Surgeon: Mavis Anes, MD;  Location: [facility];  Service: Gastroenterology;  Laterality: N/A;    OB History     Gravida  2   Para  2   Term      Preterm      AB      Living  2      SAB      IAB      Ectopic      Multiple      Live Births  2           No Known Allergies  Social History   Socioeconomic History   Marital status: Married    Spouse name: Not on file    Number of children: 2   Years of education: Not on file   Highest education level: Not on file  Occupational History   Not on file  Tobacco Use   Smoking status: Never   Smokeless tobacco: Never  Vaping Use   Vaping status: Never Used  Substance and Sexual Activity   Alcohol use: Yes    Comment: socially    Drug use: No   Sexual activity: Yes    Birth control/protection: Post-menopausal  Other Topics Concern   Not on file  Social History Narrative   Not on file   Social Drivers of Health   Financial Resource Strain: Not on file  Food Insecurity: Not on file  Transportation Needs: Not on file  Physical Activity: Not on file  Stress: Not on file  Social Connections: Not on file    Family History  Problem Relation Age of Onset   Coronary artery disease Paternal Grandmother    Cancer Maternal Grandfather        throat   Coronary artery disease  Father    Cancer Paternal Uncle        colon   Cancer Maternal Uncle        lung   Glaucoma Mother    Cataracts Mother    Asthma Son    Autism Son

## 2024-07-24 MED ORDER — PROGESTERONE 200 MG PO CAPS: ORAL_CAPSULE | ORAL | 12 refills | Status: DC

## 2024-08-14 ENCOUNTER — Ambulatory Visit: Payer: Self-pay
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
## 2024-08-17 ENCOUNTER — Ambulatory Visit: Payer: Self-pay | Admitting: Family Medicine

## 2024-08-17 ENCOUNTER — Encounter: Payer: Self-pay | Admitting: Family Medicine

## 2024-08-17 VITALS — BP 128/73 | HR 96 | Ht 66.5 in | Wt 213.0 lb

## 2024-08-17 DIAGNOSIS — M545 Low back pain, unspecified: Secondary | ICD-10-CM | POA: Diagnosis not present

## 2024-08-17 DIAGNOSIS — T1490XA Injury, unspecified, initial encounter: Secondary | ICD-10-CM | POA: Insufficient documentation

## 2024-08-17 MED ORDER — MELOXICAM 15 MG PO TABS: 15.0000 mg | ORAL_TABLET | Freq: Every day | ORAL | 0 refills | Status: DC | PRN
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
## 2024-08-17 NOTE — Assessment & Plan Note (Signed)
 Advised heat.  Supportive care.  Meloxicam as directed.
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
## 2024-08-17 NOTE — Patient Instructions (Signed)
 Xray ordered.  Medication as directed.  Take care  Dr. Bluford

## 2024-09-09 DIAGNOSIS — E669 Obesity, unspecified: Secondary | ICD-10-CM | POA: Diagnosis not present

## 2024-09-09 DIAGNOSIS — E782 Mixed hyperlipidemia: Secondary | ICD-10-CM | POA: Diagnosis not present

## 2024-09-09 DIAGNOSIS — G43009 Migraine without aura, not intractable, without status migrainosus: Secondary | ICD-10-CM | POA: Diagnosis not present

## 2024-09-09 DIAGNOSIS — Z6833 Body mass index (BMI) 33.0-33.9, adult: Secondary | ICD-10-CM | POA: Diagnosis not present

## 2024-09-21 ENCOUNTER — Ambulatory Visit

## 2024-09-21 VITALS — BP 126/81 | HR 96 | Temp 97.7°F | Ht 66.5 in | Wt 215.2 lb

## 2024-09-21 DIAGNOSIS — J069 Acute upper respiratory infection, unspecified: Secondary | ICD-10-CM | POA: Diagnosis not present

## 2024-09-21 DIAGNOSIS — R051 Acute cough: Secondary | ICD-10-CM

## 2024-09-21 MED ORDER — BENZONATATE 100 MG PO CAPS: 100.0000 mg | ORAL_CAPSULE | Freq: Two times a day (BID) | ORAL | 0 refills | Status: AC | PRN

## 2024-09-21 MED ORDER — FLUTICASONE PROPIONATE 50 MCG/ACT NA SUSP: 2.0000 | Freq: Every day | NASAL | 2 refills | Status: AC

## 2024-09-21 NOTE — Progress Notes (Addendum)
 "  Acute Office Visit  Subjective:     Patient ID: Rita Smith, female    DOB: 04-28-1966, 58 y.o.   MRN: [id number]  Chief Complaint  Patient presents with   Cough    Patient is here for having a cough/drainage for about a week or so. Hacking up greenish mucus.     HPI Rita Smith presents today complaining of cough, malaise, nasal drainage, mild headaches and congestion since Tuesday. Husband has been sick at home as well. Denies fever, chills, or body aches. Cough is mostly at night, and is sometimes productive with green to yellow drainage. Nasal drainage is clear. Reports popping in the ears as well. Has been taking cough drops, motrin and tylenol to help relieve symptoms. 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   Review of Systems  Constitutional:  Positive for malaise/fatigue. Negative for chills and fever.  HENT:  Negative for sore throat.   Respiratory:  Positive for cough. Negative for shortness of breath and wheezing.   Cardiovascular:  Negative for chest pain.  Neurological:  Positive for headaches. Negative for weakness.      Objective:    Today's Vitals   09/21/24 1635  BP: 126/81  Pulse: 96  Temp: 97.7 F (36.5 C)  SpO2: 99%  Weight: 215 lb 4 oz (97.6 kg)  Height: 5' 6.5 (1.689 m)   Body mass index is 34.22 kg/m.   Physical Exam Vitals and nursing note reviewed.  Constitutional:      General: She is not in acute distress.    Appearance: Normal appearance. She is not ill-appearing.  HENT:     Right Ear: Hearing, ear canal and external ear normal. Tympanic membrane is retracted. Tympanic membrane is not injected.     Left Ear: Hearing, ear canal and external ear normal. Tympanic membrane is retracted. Tympanic membrane is not injected.     Mouth/Throat:     Mouth: Mucous membranes are moist.     Pharynx: No oropharyngeal exudate or posterior oropharyngeal erythema.  Cardiovascular:     Rate and Rhythm: Normal rate and regular rhythm.     Heart  sounds: Normal heart sounds, S1 normal and S2 normal.  Pulmonary:     Effort: Pulmonary effort is normal. No respiratory distress.     Breath sounds: Normal breath sounds. No wheezing.  Lymphadenopathy:     Cervical: No cervical adenopathy.  Neurological:     Mental Status: She is alert.  Psychiatric:        Mood and Affect: Mood normal.        Behavior: Behavior normal.        Thought Content: Thought content normal.        Judgment: Judgment normal.       Assessment & Plan:  1. Viral upper respiratory tract infection (Primary) -Discussed with patient that this is most likely viral. Patient looks good today on exam, and is non ill-appearing. Would like to treat this conservatively at this time, educated patient that this will most likely resolve within 10 days. Hesitant to treat with antibiotics at this time due to absence of fever, purulent drainage, and the patient seems to be improving.  -Advised to take Delsym, tylenol or motrin as prescribed on label for symptom management.  -Notified the patient that if she begins to experience fever, or if her symptoms suddenly worsen to message on MyChart.  -Instructed patient on the use of flonase , and educated her that it should help with  ear congestion.  - fluticasone  (FLONASE ) 50 MCG/ACT nasal spray; Place 2 sprays into both nostrils daily.  Dispense: 16 g; Refill: 2  2. Acute cough - benzonatate  (TESSALON ) 100 MG capsule; Take 1 capsule (100 mg total) by mouth 2 (two) times daily as needed for cough.  Dispense: 20 capsule; Refill: 0   Return if symptoms worsen or fail to improve.    Damien KATHEE Pringle, FNP  Addendum:  Patient was seen at [facility] on 09/21/2024. Originally entered in error.   Thanks, Damien Pringle, FNP-BC  "

## 2024-09-24 ENCOUNTER — Other Ambulatory Visit (HOSPITAL_COMMUNITY): Payer: Self-pay | Admitting: Family Medicine

## 2024-09-24 DIAGNOSIS — Z1231 Encounter for screening mammogram for malignant neoplasm of breast: Secondary | ICD-10-CM

## 2024-09-28 ENCOUNTER — Encounter (HOSPITAL_COMMUNITY)

## 2024-09-28 ENCOUNTER — Ambulatory Visit (HOSPITAL_COMMUNITY)
Admission: RE | Admit: 2024-09-28 | Discharge: 2024-09-28 | Disposition: A | Source: Ambulatory Visit | Attending: Family Medicine | Admitting: Family Medicine

## 2024-09-28 ENCOUNTER — Encounter (HOSPITAL_COMMUNITY): Payer: Self-pay

## 2024-09-28 DIAGNOSIS — Z1231 Encounter for screening mammogram for malignant neoplasm of breast: Secondary | ICD-10-CM

## 2024-10-05 DIAGNOSIS — B351 Tinea unguium: Secondary | ICD-10-CM | POA: Diagnosis not present

## 2024-10-05 DIAGNOSIS — M79674 Pain in right toe(s): Secondary | ICD-10-CM | POA: Diagnosis not present

## 2024-10-05 DIAGNOSIS — M79675 Pain in left toe(s): Secondary | ICD-10-CM | POA: Diagnosis not present

## 2024-11-05 ENCOUNTER — Ambulatory Visit: Admitting: Obstetrics & Gynecology

## 2024-11-09 ENCOUNTER — Ambulatory Visit: Admitting: Obstetrics & Gynecology

## 2024-11-09 ENCOUNTER — Encounter: Payer: Self-pay | Admitting: Obstetrics & Gynecology

## 2024-11-09 VITALS — BP 128/85 | HR 85 | Ht 66.5 in | Wt 216.6 lb

## 2024-11-09 DIAGNOSIS — N950 Postmenopausal bleeding: Secondary | ICD-10-CM

## 2024-11-09 NOTE — Progress Notes (Signed)
 Follow up appointment for results: Response to cyclical prometrium   Chief Complaint  Patient presents with   Follow-up    Prometrium     Blood pressure 128/85, pulse 85, height 5' 6.5 (1.689 m), weight 216 lb 9.6 oz (98.2 kg), last menstrual period 02/13/2021.  Had PMB with ES 14 mm, endometrial biopsy benign inactive endometrium But with that width, I decided to cycle with prometrium  200 mg x 10 d per month She is spotting cyclically as a withdrawal monthly Not heavy but a couple of days which demonstrates interval endometrial stimulation/development As a result recommend to continue, re evaluate her cyclical bleeding pattern in 1 year for ongoing management plan  MEDS ordered this encounter: No orders of the defined types were placed in this encounter.   Orders for this encounter: No orders of the defined types were placed in this encounter.   Impression + Management Plan   ICD-10-CM   1. PMB (postmenopausal bleeding) with 14 mm ES, benign EMBx, + response to cyclical provera: continue the cyclical provera since she is having interval endometrial development as demonstrated  N95.0       Follow Up: No follow-ups on file.     All questions were answered.  Past Medical History:  Diagnosis Date   Colitis    Headache(784.0)    Rosacea    Urinary frequency 06/15/2015   Uveitis    Varicose vein of leg 05/17/2014   Vitamin D  deficiency     Past Surgical History:  Procedure Laterality Date   CESAREAN SECTION     COLONOSCOPY     COLONOSCOPY WITH PROPOFOL  N/A 01/30/2022   Procedure: COLONOSCOPY WITH PROPOFOL ;  Surgeon: Mavis Anes, MD;  Location: [facility];  Service: Gastroenterology;  Laterality: N/A;    OB History     Gravida  2   Para  2   Term      Preterm      AB      Living  2      SAB      IAB      Ectopic      Multiple      Live Births  2           Allergies[1]  Social History   Socioeconomic History   Marital status:  Married    Spouse name: Not on file   Number of children: 2   Years of education: Not on file   Highest education level: Not on file  Occupational History   Not on file  Tobacco Use   Smoking status: Never   Smokeless tobacco: Never  Vaping Use   Vaping status: Never Used  Substance and Sexual Activity   Alcohol use: Yes    Comment: socially    Drug use: No   Sexual activity: Yes    Birth control/protection: Post-menopausal  Other Topics Concern   Not on file  Social History Narrative   Not on file   Social Drivers of Health   Tobacco Use: Low Risk (11/09/2024)   Patient History    Smoking Tobacco Use: Never    Smokeless Tobacco Use: Never    Passive Exposure: Not on file  Financial Resource Strain: Not on file  Food Insecurity: Not on file  Transportation Needs: Not on file  Physical Activity: Not on file  Stress: Not on file  Social Connections: Not on file  Depression (PHQ2-9): Low Risk (03/11/2024)   Depression (PHQ2-9)    PHQ-2 Score: 3  Alcohol  Screen: Not on file  Housing: Not on file  Utilities: Not on file  Health Literacy: Not on file    Family History  Problem Relation Age of Onset   Coronary artery disease Paternal Grandmother    Cancer Maternal Grandfather        throat   Coronary artery disease Father    Cancer Paternal Uncle        colon   Cancer Maternal Uncle        lung   Glaucoma Mother    Cataracts Mother    Asthma Son    Autism Son        [1] No Known Allergies

## 2024-11-12 ENCOUNTER — Ambulatory Visit: Admitting: Adult Health

## 2024-11-12 ENCOUNTER — Other Ambulatory Visit (HOSPITAL_COMMUNITY)
Admission: RE | Admit: 2024-11-12 | Discharge: 2024-11-12 | Disposition: A | Source: Ambulatory Visit | Attending: Adult Health | Admitting: Adult Health

## 2024-11-12 ENCOUNTER — Encounter: Payer: Self-pay | Admitting: Adult Health

## 2024-11-12 VITALS — BP 122/82 | HR 103 | Ht 66.5 in | Wt 216.5 lb

## 2024-11-12 DIAGNOSIS — N9089 Other specified noninflammatory disorders of vulva and perineum: Secondary | ICD-10-CM | POA: Diagnosis not present

## 2024-11-12 DIAGNOSIS — Z1151 Encounter for screening for human papillomavirus (HPV): Secondary | ICD-10-CM | POA: Diagnosis not present

## 2024-11-12 DIAGNOSIS — N950 Postmenopausal bleeding: Secondary | ICD-10-CM

## 2024-11-12 DIAGNOSIS — Z01419 Encounter for gynecological examination (general) (routine) without abnormal findings: Secondary | ICD-10-CM

## 2024-11-12 NOTE — Progress Notes (Signed)
 Patient ID: Rita Smith, female   DOB: 04/23/1966, 59 y.o.   MRN: [id number] History of Present Illness: Rita Smith is a 59 year old white female, PM in for a well woman gyn exam and pap. Has noticed area left vulva, it did bleed some.   PCP is Dr Rita Smith    Current Medications, Allergies, Past Medical History, Past Surgical History, Family History and Social History were reviewed in [employer] electronic medical record.     Review of Systems: Patient denies any headaches, hearing loss, fatigue, blurred vision, shortness of breath, chest pain, abdominal pain, problems with bowel movements, urination, or intercourse. No joint pain or mood swings.  Has had some bleeding on Prometrium , had benign EMB 07/07/24  See HPI for positives   Physical Exam:BP 122/82 (BP Location: Left Arm, Patient Position: Sitting, Cuff Size: Large)   Pulse (!) 103   Ht 5' 6.5 (1.689 m)   Wt 216 lb 8 oz (98.2 kg)   LMP 02/13/2021 Comment: spotting  BMI 34.42 kg/m   General:  Well developed, well nourished, no acute distress Skin:  Warm and dry Neck:  Midline trachea, normal thyroid, good ROM, no lymphadenopathy, no carotid bruits heard  Lungs; Clear to auscultation bilaterally Breast:  No dominant palpable mass, retraction, or nipple discharge Cardiovascular: Regular rate and rhythm Abdomen:  Soft, non tender, no hepatosplenomegaly Pelvic:  External genitalia is normal in appearance, has 1 cm  round flesh colored lesion left vulva near the top with 2 mm black area in middle, feels slightly raised.  The vagina is normal in appearance. Urethra has no lesions or masses. The cervix is smooth, pap with HR HPV genotyping performed.  Uterus is felt to be normal size, shape, and contour.  No adnexal masses or tenderness noted.Bladder is non tender, no masses felt. Rectal: Deferred Extremities/musculoskeletal:  No swelling or varicosities noted, no clubbing or cyanosis Psych:  No mood changes, alert and  cooperative,seems happy AA is 1 Fall risk is low    11/12/2024    1:46 PM 03/11/2024    4:22 PM 03/14/2023    9:11 AM  Depression screen PHQ 2/9  Decreased Interest 0 0 0  Down, Depressed, Hopeless 0 0 1  PHQ - 2 Score 0 0 1  Altered sleeping 0 1 0  Tired, decreased energy 1 1 1   Change in appetite 0 0 1  Feeling bad or failure about yourself  0 0 0  Trouble concentrating 0 1 0  Moving slowly or fidgety/restless 0 0 0  Suicidal thoughts 0 0 0  PHQ-9 Score 1 3  3    Difficult doing work/chores  Somewhat difficult Somewhat difficult     Data saved with a previous flowsheet row definition       11/12/2024    1:46 PM 03/11/2024    4:22 PM 09/26/2022    3:56 PM  GAD 7 : Generalized Anxiety Score  Nervous, Anxious, on Edge 0 1  0   Control/stop worrying 0 0  0   Worry too much - different things 0 0  0   Trouble relaxing 1 0  0   Restless 0 0  0   Easily annoyed or irritable 1 0  0   Afraid - awful might happen 0 0  0   Total GAD 7 Score 2 1 0  Anxiety Difficulty  Not difficult at all Not difficult at all     Data saved with a previous flowsheet row definition  Upstream - 11/12/24 1341       Pregnancy Intention Screening   Does the patient want to become pregnant in the next year? No    Does the patient's partner want to become pregnant in the next year? No    Would the patient like to discuss contraceptive options today? No      Contraception Wrap Up   Current Method Post-Menopause    End Method Post-Menopause    Contraception Counseling Provided No         Examination chaperoned by Rita Salt LPN   Impression and plan: 1. Encounter for gynecological examination with Papanicolaou smear of cervix (Primary) Pap sent Pap in 3 years If negative Physical in 1 year Labs with PCP Mammogram was negative 09/28/24 Colonoscopy per GI  - Cytology - PAP( [facility])  2. Lesion of vulva Will recheck in 2 weeks if not resolved, may needs biopsy   3. PMB  (postmenopausal bleeding) with 14 mm ES, benign EMBx, + response to cyclical provera: continue the cyclical provera since she is having interval endometrial development as demonstrated

## 2024-11-17 ENCOUNTER — Ambulatory Visit: Payer: Self-pay | Admitting: Adult Health

## 2024-11-17 LAB — CYTOLOGY - PAP
Comment: NEGATIVE
Diagnosis: NEGATIVE
High risk HPV: NEGATIVE

## 2024-11-26 ENCOUNTER — Ambulatory Visit: Admitting: Adult Health

## 2024-11-26 ENCOUNTER — Encounter: Payer: Self-pay | Admitting: Adult Health

## 2024-11-26 VITALS — BP 137/83 | HR 111 | Ht 66.5 in | Wt 216.5 lb

## 2024-11-26 DIAGNOSIS — N9089 Other specified noninflammatory disorders of vulva and perineum: Secondary | ICD-10-CM

## 2024-11-26 NOTE — Progress Notes (Signed)
" °  Subjective:     Patient ID: Rita Smith, female   DOB: 1965/12/05, 59 y.o.   MRN: [id number]  HPI Rita Smith is a 59 year old white female, married, PM back in follow up of vulva lesion has seen more blood on pad.     Component Value Date/Time   DIAGPAP  11/12/2024 1344    - Negative for intraepithelial lesion or malignancy (NILM)   DIAGPAP  05/16/2022 1117    - Negative for intraepithelial lesion or malignancy (NILM)   DIAGPAP  09/24/2019 1544    - Negative for intraepithelial lesion or malignancy (NILM)   HPVHIGH Negative 11/12/2024 1344   HPVHIGH Negative 05/16/2022 1117   HPVHIGH Negative 09/24/2019 1544   ADEQPAP  11/12/2024 1344    Satisfactory for evaluation; transformation zone component PRESENT.   ADEQPAP  05/16/2022 1117    Satisfactory for evaluation; transformation zone component PRESENT.   ADEQPAP  09/24/2019 1544    Satisfactory for evaluation; transformation zone component PRESENT.    PCP is Dr Alphonsa  Review of Systems Has seen blood on pad Reviewed past medical,surgical, social and family history. Reviewed medications and allergies.     Objective:   Physical Exam BP 137/83 (BP Location: Left Arm, Patient Position: Sitting, Cuff Size: Large)   Pulse (!) 111   Ht 5' 6.5 (1.689 m)   Wt 216 lb 8 oz (98.2 kg)   LMP 02/13/2021 Comment: spotting  BMI 34.42 kg/m     Skin warm and dry. Pelvic: External genitalia is normal in appearance, has 1 cm round flesh colored lesion left vulva near the top with 2 mm black area in middle, feels slightly raised. No change noted, looks like could have been comedone at one time.   Upstream - 11/26/24 1606       Pregnancy Intention Screening   Does the patient want to become pregnant in the next year? N/A    Does the patient's partner want to become pregnant in the next year? N/A    Would the patient like to discuss contraceptive options today? N/A      Contraception Wrap Up   Current Method Post-Menopause    End Method  Post-Menopause    Contraception Counseling Provided No         Examination chaperoned by Clarita Salt LPN  Assessment:     1. Lesion of vulva (Primary)  Has seen blood on pad, no change in appearance, will bring back for removal or biopsy with Dr Ozan     Plan:     Return 12/23/24 with Dr Ozan for biopsy or removal     "

## 2024-12-23 ENCOUNTER — Ambulatory Visit: Admitting: Obstetrics & Gynecology

## 2025-04-02 ENCOUNTER — Ambulatory Visit: Admitting: Family Medicine
